# Patient Record
Sex: Male | Born: 1975
Health system: Southern US, Community
[De-identification: ages and names within clinical notes are randomized; demographics above are authoritative.]

## PROBLEM LIST (undated history)

## (undated) DIAGNOSIS — F1410 Cocaine abuse, uncomplicated: Secondary | ICD-10-CM

## (undated) DIAGNOSIS — I10 Essential (primary) hypertension: Secondary | ICD-10-CM

## (undated) DIAGNOSIS — T50901A Poisoning by unspecified drugs, medicaments and biological substances, accidental (unintentional), initial encounter: Secondary | ICD-10-CM

## (undated) DIAGNOSIS — F1110 Opioid abuse, uncomplicated: Secondary | ICD-10-CM

## (undated) DIAGNOSIS — E119 Type 2 diabetes mellitus without complications: Secondary | ICD-10-CM

## (undated) DIAGNOSIS — W3400XA Accidental discharge from unspecified firearms or gun, initial encounter: Secondary | ICD-10-CM

## (undated) HISTORY — PX: OTHER SURGICAL HISTORY: SHX169

## (undated) HISTORY — PX: ACHILLES TENDON SURGERY: SHX542

---

## 2016-06-25 ENCOUNTER — Emergency Department (HOSPITAL_COMMUNITY)
Admission: EM | Admit: 2016-06-25 | Discharge: 2016-06-25 | Disposition: A | Discharge status: Home / Self Care | Payer: Self-pay | Attending: Emergency Medicine | Admitting: Emergency Medicine

## 2016-06-25 ENCOUNTER — Encounter (HOSPITAL_COMMUNITY): Payer: Self-pay | Admitting: Emergency Medicine

## 2016-06-25 DIAGNOSIS — R197 Diarrhea, unspecified: Secondary | ICD-10-CM | POA: Insufficient documentation

## 2016-06-25 DIAGNOSIS — E119 Type 2 diabetes mellitus without complications: Secondary | ICD-10-CM | POA: Insufficient documentation

## 2016-06-25 DIAGNOSIS — F17200 Nicotine dependence, unspecified, uncomplicated: Secondary | ICD-10-CM | POA: Insufficient documentation

## 2016-06-25 DIAGNOSIS — I10 Essential (primary) hypertension: Secondary | ICD-10-CM | POA: Insufficient documentation

## 2016-06-25 DIAGNOSIS — R109 Unspecified abdominal pain: Secondary | ICD-10-CM

## 2016-06-25 DIAGNOSIS — R112 Nausea with vomiting, unspecified: Secondary | ICD-10-CM | POA: Insufficient documentation

## 2016-06-25 DIAGNOSIS — R1110 Vomiting, unspecified: Secondary | ICD-10-CM

## 2016-06-25 HISTORY — DX: Type 2 diabetes mellitus without complications: E11.9

## 2016-06-25 HISTORY — DX: Accidental discharge from unspecified firearms or gun, initial encounter: W34.00XA

## 2016-06-25 LAB — COMPREHENSIVE METABOLIC PANEL
ALBUMIN: 4.9 g/dL (ref 3.5–5.0)
ALT: 20 U/L (ref 17–63)
ANION GAP: 7 (ref 5–15)
AST: 20 U/L (ref 15–41)
Alkaline Phosphatase: 52 U/L (ref 38–126)
BILIRUBIN TOTAL: 0.5 mg/dL (ref 0.3–1.2)
BUN: 13 mg/dL (ref 6–20)
CHLORIDE: 105 mmol/L (ref 101–111)
CO2: 25 mmol/L (ref 22–32)
Calcium: 9.7 mg/dL (ref 8.9–10.3)
Creatinine, Ser: 1.13 mg/dL (ref 0.61–1.24)
GFR calc Af Amer: >60 mL/min (ref >60)
GLUCOSE: 185 mg/dL — AB (ref 65–99)
POTASSIUM: 3.4 mmol/L — AB (ref 3.5–5.1)
Sodium: 137 mmol/L (ref 135–145)
TOTAL PROTEIN: 7.9 g/dL (ref 6.5–8.1)

## 2016-06-25 LAB — URINALYSIS, ROUTINE W REFLEX MICROSCOPIC
Bilirubin Urine: NEGATIVE
GLUCOSE, UA: NEGATIVE mg/dL
Hgb urine dipstick: NEGATIVE
Ketones, ur: NEGATIVE mg/dL
LEUKOCYTES UA: NEGATIVE
NITRITE: NEGATIVE
PH: 7 (ref 5.0–8.0)
Protein, ur: NEGATIVE mg/dL
SPECIFIC GRAVITY, URINE: 1.018 (ref 1.005–1.030)

## 2016-06-25 LAB — CBC
HEMATOCRIT: 39.7 % (ref 39.0–52.0)
HEMOGLOBIN: 13.6 g/dL (ref 13.0–17.0)
MCH: 27.8 pg (ref 26.0–34.0)
MCHC: 34.3 g/dL (ref 30.0–36.0)
MCV: 81 fL (ref 78.0–100.0)
Platelets: 151 10*3/uL (ref 150–400)
RBC: 4.9 MIL/uL (ref 4.22–5.81)
RDW: 11.9 % (ref 11.5–15.5)
WBC: 10.5 10*3/uL (ref 4.0–10.5)

## 2016-06-25 LAB — LIPASE, BLOOD: LIPASE: 30 U/L (ref 11–51)

## 2016-06-25 LAB — CBG MONITORING, ED: Glucose-Capillary: 186 mg/dL — ABNORMAL HIGH (ref 65–99)

## 2016-06-25 MED ORDER — ONDANSETRON HCL 4 MG/2ML IJ SOLN
4.0000 mg | Freq: Once | INTRAMUSCULAR | Status: AC
  Administered 2016-06-25: 4 mg via INTRAVENOUS
  Filled 2016-06-25: qty 2

## 2016-06-25 MED ORDER — HALOPERIDOL LACTATE 5 MG/ML IJ SOLN
2.0000 mg | Freq: Once | INTRAMUSCULAR | Status: AC
  Administered 2016-06-25: 2 mg via INTRAVENOUS
  Filled 2016-06-25: qty 1

## 2016-06-25 MED ORDER — ONDANSETRON 4 MG PO TBDP: 4.0000 mg | ORAL_TABLET | Freq: Three times a day (TID) | ORAL | 0 refills | Status: DC | PRN

## 2016-06-25 MED ORDER — SODIUM CHLORIDE 0.9 % IV BOLUS (SEPSIS)
1000.0000 mL | Freq: Once | INTRAVENOUS | Status: AC
  Administered 2016-06-25: 1000 mL via INTRAVENOUS
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
## 2016-06-25 NOTE — ED Notes (Signed)
Patient states he ate fried chicken and instant mashed potato's for dinner last night. States he went to bed at midnight and awoke with nausea and vomiting at 0200 and 2 loose stools.  Patient complaining of diffuse intermittent pain with tenderness on palpation in all 4 quads. No vomiting at this time.

## 2016-06-25 NOTE — ED Notes (Signed)
Pt given ginger ale and graham crackers to see how he tolerates it.

## 2016-06-25 NOTE — ED Notes (Signed)
Pt ambulatory to restroom

## 2016-06-25 NOTE — ED Triage Notes (Signed)
Pt is c/o severe abd pain that started one to two hours ago  Pt is having nausea, vomiting, and diarrhea  Actively vomiting in triage

## 2017-06-17 ENCOUNTER — Encounter (HOSPITAL_COMMUNITY): Payer: Self-pay | Admitting: Emergency Medicine

## 2017-06-17 ENCOUNTER — Emergency Department (HOSPITAL_COMMUNITY)
Admission: EM | Admit: 2017-06-17 | Discharge: 2017-06-17 | Disposition: A | Discharge status: Home / Self Care | Payer: Self-pay | Attending: Emergency Medicine | Admitting: Emergency Medicine

## 2017-06-17 DIAGNOSIS — Z9114 Patient's other noncompliance with medication regimen: Secondary | ICD-10-CM | POA: Insufficient documentation

## 2017-06-17 DIAGNOSIS — I10 Essential (primary) hypertension: Secondary | ICD-10-CM | POA: Insufficient documentation

## 2017-06-17 DIAGNOSIS — M79672 Pain in left foot: Secondary | ICD-10-CM | POA: Insufficient documentation

## 2017-06-17 DIAGNOSIS — F17210 Nicotine dependence, cigarettes, uncomplicated: Secondary | ICD-10-CM | POA: Insufficient documentation

## 2017-06-17 DIAGNOSIS — E119 Type 2 diabetes mellitus without complications: Secondary | ICD-10-CM | POA: Insufficient documentation

## 2017-06-17 HISTORY — DX: Essential (primary) hypertension: I10

## 2017-06-17 LAB — BASIC METABOLIC PANEL
ANION GAP: 8 (ref 5–15)
BUN: 8 mg/dL (ref 6–20)
CHLORIDE: 105 mmol/L (ref 101–111)
CO2: 27 mmol/L (ref 22–32)
Calcium: 9.4 mg/dL (ref 8.9–10.3)
Creatinine, Ser: 1.06 mg/dL (ref 0.61–1.24)
GFR calc Af Amer: >60 mL/min (ref >60)
GFR calc non Af Amer: >60 mL/min (ref >60)
GLUCOSE: 172 mg/dL — AB (ref 65–99)
POTASSIUM: 3.9 mmol/L (ref 3.5–5.1)
Sodium: 140 mmol/L (ref 135–145)

## 2017-06-17 LAB — CBC
HEMATOCRIT: 39.2 % (ref 39.0–52.0)
HEMOGLOBIN: 13.2 g/dL (ref 13.0–17.0)
MCH: 27.7 pg (ref 26.0–34.0)
MCHC: 33.7 g/dL (ref 30.0–36.0)
MCV: 82.4 fL (ref 78.0–100.0)
Platelets: 181 10*3/uL (ref 150–400)
RBC: 4.76 MIL/uL (ref 4.22–5.81)
RDW: 12 % (ref 11.5–15.5)
WBC: 6.7 10*3/uL (ref 4.0–10.5)

## 2017-06-17 LAB — CBG MONITORING, ED: Glucose-Capillary: 169 mg/dL — ABNORMAL HIGH (ref 65–99)

## 2017-06-17 MED ORDER — LISINOPRIL 10 MG PO TABS: 10.0000 mg | ORAL_TABLET | Freq: Every day | ORAL | 2 refills | Status: DC

## 2017-06-17 MED ORDER — METFORMIN HCL 500 MG PO TABS: 500.0000 mg | ORAL_TABLET | Freq: Two times a day (BID) | ORAL | 2 refills | Status: DC

## 2017-06-17 NOTE — ED Triage Notes (Signed)
Patient here from home with complaints of hypertension. States that he has been out of medication for a year. Hx of diabetes. Out of metformin. Bilateral foot pain and swelling.
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
## 2017-06-28 ENCOUNTER — Emergency Department (HOSPITAL_COMMUNITY)
Admission: EM | Admit: 2017-06-28 | Discharge: 2017-06-29 | Disposition: A | Discharge status: Home / Self Care | Payer: Self-pay | Attending: Emergency Medicine | Admitting: Emergency Medicine

## 2017-06-28 ENCOUNTER — Emergency Department (HOSPITAL_COMMUNITY): Payer: Self-pay

## 2017-06-28 ENCOUNTER — Encounter (HOSPITAL_COMMUNITY): Payer: Self-pay

## 2017-06-28 DIAGNOSIS — J0110 Acute frontal sinusitis, unspecified: Secondary | ICD-10-CM | POA: Insufficient documentation

## 2017-06-28 DIAGNOSIS — I10 Essential (primary) hypertension: Secondary | ICD-10-CM | POA: Insufficient documentation

## 2017-06-28 DIAGNOSIS — E119 Type 2 diabetes mellitus without complications: Secondary | ICD-10-CM | POA: Insufficient documentation

## 2017-06-28 DIAGNOSIS — F17200 Nicotine dependence, unspecified, uncomplicated: Secondary | ICD-10-CM | POA: Insufficient documentation

## 2017-06-28 DIAGNOSIS — R062 Wheezing: Secondary | ICD-10-CM | POA: Insufficient documentation

## 2017-06-28 LAB — CBG MONITORING, ED: GLUCOSE-CAPILLARY: 181 mg/dL — AB (ref 65–99)

## 2017-06-28 MED ORDER — FLUTICASONE PROPIONATE 50 MCG/ACT NA SUSP: 1.0000 | Freq: Every day | NASAL | 0 refills | Status: DC

## 2017-06-28 MED ORDER — DOXYCYCLINE HYCLATE 100 MG PO CAPS: 100.0000 mg | ORAL_CAPSULE | Freq: Two times a day (BID) | ORAL | 0 refills | Status: AC

## 2017-06-28 MED ORDER — ALBUTEROL SULFATE (2.5 MG/3ML) 0.083% IN NEBU
2.5000 mg | INHALATION_SOLUTION | Freq: Once | RESPIRATORY_TRACT | Status: AC
Start: 2017-06-28 — End: 2017-06-28
  Administered 2017-06-28: 2.5 mg via RESPIRATORY_TRACT
  Filled 2017-06-28: qty 3

## 2017-06-28 MED ORDER — DOXYCYCLINE HYCLATE 100 MG PO TABS
100.0000 mg | ORAL_TABLET | Freq: Once | ORAL | Status: AC
  Administered 2017-06-28: 100 mg via ORAL
  Filled 2017-06-28: qty 1
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
## 2017-06-28 NOTE — ED Notes (Signed)
Hope, NP would like patients level of care to be higher due to his complaints and medical history. After patient returned from radiology, patient was moved to room 2. Care transferred to [facility] [place], [place].

## 2017-06-28 NOTE — ED Notes (Addendum)
Pt c/o congestion, productive cough with yellowish/greenish sputum, 9/10 headache that started Friday.

## 2017-06-28 NOTE — ED Triage Notes (Signed)
Pt c/o headache and cough/ congestion, and chills since Friday. He endorses green phlegm. Pt denies fevers, neck pain, or N/V. A&Ox4. States that his sugars have been WDL.

## 2017-06-29 ENCOUNTER — Telehealth: Payer: Self-pay | Admitting: Emergency Medicine

## 2017-06-29 MED ORDER — ACETAMINOPHEN 325 MG PO TABS
650.0000 mg | ORAL_TABLET | Freq: Once | ORAL | Status: AC
  Administered 2017-06-29: 650 mg via ORAL
  Filled 2017-06-29: qty 2

## 2017-06-29 MED FILL — DOXYCYCLINE 100 MG TABLET: 100 | 7 days supply | Qty: 14 | Fill #0

## 2017-06-29 NOTE — Telephone Encounter (Signed)
Pt called in stating he was told to go to [facility] for his two prescriptions from his ED visit last night.  Pt states they are too expensive.  Noted pt has a follow up appointment scheduled for 8/30.  Advised pt to go to the [facility] today to get his prescriptions.  Pt acknowledged understanding.  No further CM needs noted at this time.
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
## 2017-07-01 ENCOUNTER — Ambulatory Visit: Payer: Self-pay | Attending: Internal Medicine | Admitting: Physician Assistant

## 2017-07-01 VITALS — BP 138/88 | HR 75 | Temp 98.1°F | Resp 18 | Ht 66.0 in | Wt 196.0 lb

## 2017-07-01 DIAGNOSIS — M79672 Pain in left foot: Secondary | ICD-10-CM | POA: Insufficient documentation

## 2017-07-01 DIAGNOSIS — Z7984 Long term (current) use of oral hypoglycemic drugs: Secondary | ICD-10-CM | POA: Insufficient documentation

## 2017-07-01 DIAGNOSIS — R3911 Hesitancy of micturition: Secondary | ICD-10-CM

## 2017-07-01 DIAGNOSIS — J329 Chronic sinusitis, unspecified: Secondary | ICD-10-CM | POA: Insufficient documentation

## 2017-07-01 DIAGNOSIS — G629 Polyneuropathy, unspecified: Secondary | ICD-10-CM

## 2017-07-01 DIAGNOSIS — E1140 Type 2 diabetes mellitus with diabetic neuropathy, unspecified: Secondary | ICD-10-CM | POA: Insufficient documentation

## 2017-07-01 DIAGNOSIS — E1165 Type 2 diabetes mellitus with hyperglycemia: Secondary | ICD-10-CM | POA: Insufficient documentation

## 2017-07-01 DIAGNOSIS — M79671 Pain in right foot: Secondary | ICD-10-CM | POA: Insufficient documentation

## 2017-07-01 DIAGNOSIS — Z79899 Other long term (current) drug therapy: Secondary | ICD-10-CM | POA: Insufficient documentation

## 2017-07-01 DIAGNOSIS — E118 Type 2 diabetes mellitus with unspecified complications: Secondary | ICD-10-CM

## 2017-07-01 DIAGNOSIS — I10 Essential (primary) hypertension: Secondary | ICD-10-CM

## 2017-07-01 LAB — POCT URINALYSIS DIPSTICK
Bilirubin, UA: NEGATIVE
Glucose, UA: NEGATIVE
Ketones, UA: NEGATIVE
Leukocytes, UA: NEGATIVE
Nitrite, UA: NEGATIVE
PH UA: 5.5 (ref 5.0–8.0)
PROTEIN UA: NEGATIVE
RBC UA: NEGATIVE
SPEC GRAV UA: 1.02 (ref 1.010–1.025)
UROBILINOGEN UA: 0.2 U/dL

## 2017-07-01 LAB — POCT GLYCOSYLATED HEMOGLOBIN (HGB A1C): HEMOGLOBIN A1C: 6.4

## 2017-07-01 LAB — GLUCOSE, POCT (MANUAL RESULT ENTRY): POC GLUCOSE: 170 mg/dL — AB (ref 70–99)

## 2017-07-01 MED ORDER — LISINOPRIL 20 MG PO TABS: 20.0000 mg | ORAL_TABLET | Freq: Every day | ORAL | 3 refills | Status: DC

## 2017-07-01 MED ORDER — METFORMIN HCL 500 MG PO TABS: 500.0000 mg | ORAL_TABLET | Freq: Two times a day (BID) | ORAL | 2 refills | Status: DC

## 2017-07-01 MED ORDER — TRUE METRIX METER W/DEVICE KIT: 1.0000 | PACK | Freq: Three times a day (TID) | 0 refills | Status: AC

## 2017-07-01 MED ORDER — BD ULTRA-FINE LANCETS MISC: 12 refills | Status: AC

## 2017-07-01 MED ORDER — GABAPENTIN 300 MG PO CAPS: 300.0000 mg | ORAL_CAPSULE | Freq: Every day | ORAL | 3 refills | Status: DC

## 2017-07-01 MED ORDER — GLUCOSE BLOOD VI STRP: ORAL_STRIP | 12 refills | Status: AC

## 2017-07-01 MED FILL — ?METFORMIN HCL 500MG TABLET: 500 | 30 days supply | Qty: 60 | Fill #0

## 2017-07-01 MED FILL — !TRUE METRIX BLOOD GLUCOSE: 1 days supply | Qty: 1 | Fill #0

## 2017-07-01 MED FILL — LISINOPRIL 20 MG TAB: 20 | 30 days supply | Qty: 30 | Fill #0

## 2017-07-01 MED FILL — GABAPENTIN 300 MG CAPSULE: 300 | 30 days supply | Qty: 30 | Fill #0

## 2017-07-01 MED FILL — TRUEplus LANCETS 28G MISC: 30 days supply | Qty: 100 | Fill #0

## 2017-07-01 MED FILL — TRUE METRIX TEST STRIP: 30 days supply | Qty: 100 | Fill #0

## 2017-07-01 NOTE — Patient Instructions (Signed)
Check blood sugars 3-5 times per week fasting and record and bring to next visit. Diabetes Mellitus and Food It is important for you to manage your blood sugar (glucose) level. Your blood glucose level can be greatly affected by what you eat. Eating healthier foods in the appropriate amounts throughout the day at about the same time each day will help you control your blood glucose level. It can also help slow or prevent worsening of your diabetes mellitus. Healthy eating may even help you improve the level of your blood pressure and reach or maintain a healthy weight. General recommendations for healthful eating and cooking habits include:  Eating meals and snacks regularly. Avoid going long periods of time without eating to lose weight.  Eating a diet that consists mainly of plant-based foods, such as fruits, vegetables, nuts, legumes, and whole grains.  Using low-heat cooking methods, such as baking, instead of high-heat cooking methods, such as deep frying.  Work with your dietitian to make sure you understand how to use the Nutrition Facts information on food labels. How can food affect me? Carbohydrates Carbohydrates affect your blood glucose level more than any other type of food. Your dietitian will help you determine how many carbohydrates to eat at each meal and teach you how to count carbohydrates. Counting carbohydrates is important to keep your blood glucose at a healthy level, especially if you are using insulin or taking certain medicines for diabetes mellitus. Alcohol Alcohol can cause sudden decreases in blood glucose (hypoglycemia), especially if you use insulin or take certain medicines for diabetes mellitus. Hypoglycemia can be a life-threatening condition. Symptoms of hypoglycemia (sleepiness, dizziness, and disorientation) are similar to symptoms of having too much alcohol. If your health care provider has given you approval to drink alcohol, do so in moderation and use the  following guidelines:  Women should not have more than one drink per day, and men should not have more than two drinks per day. One drink is equal to: ? 12 oz of beer. ? 5 oz of wine. ? 1 oz of hard liquor.  Do not drink on an empty stomach.  Keep yourself hydrated. Have water, diet soda, or unsweetened iced tea.  Regular soda, juice, and other mixers might contain a lot of carbohydrates and should be counted.  What foods are not recommended? As you make food choices, it is important to remember that all foods are not the same. Some foods have fewer nutrients per serving than other foods, even though they might have the same number of calories or carbohydrates. It is difficult to get your body what it needs when you eat foods with fewer nutrients. Examples of foods that you should avoid that are high in calories and carbohydrates but low in nutrients include:  Trans fats (most processed foods list trans fats on the Nutrition Facts label).  Regular soda.  Juice.  Candy.  Sweets, such as cake, pie, doughnuts, and cookies.  Fried foods.  What foods can I eat? Eat nutrient-rich foods, which will nourish your body and keep you healthy. The food you should eat also will depend on several factors, including:  The calories you need.  The medicines you take.  Your weight.  Your blood glucose level.  Your blood pressure level.  Your cholesterol level.  You should eat a variety of foods, including:  Protein. ? Lean cuts of meat. ? Proteins low in saturated fats, such as fish, egg whites, and beans. Avoid processed meats.  Fruits and  vegetables. ? Fruits and vegetables that may help control blood glucose levels, such as apples, mangoes, and yams.  Dairy products. ? Choose fat-free or low-fat dairy products, such as milk, yogurt, and cheese.  Grains, bread, pasta, and rice. ? Choose whole grain products, such as multigrain bread, whole oats, and brown rice. These foods may  help control blood pressure.  Fats. ? Foods containing healthful fats, such as nuts, avocado, olive oil, canola oil, and fish.  Does everyone with diabetes mellitus have the same meal plan? Because every person with diabetes mellitus is different, there is not one meal plan that works for everyone. It is very important that you meet with a dietitian who will help you create a meal plan that is just right for you. This information is not intended to replace advice given to you by your health care provider. Make sure you discuss any questions you have with your health care provider. Document Released: 07/16/2005 Document Revised: 03/26/2016 Document Reviewed: 09/15/2013 Elsevier Interactive Patient Education  2017 [employer].

## 2017-07-29 ENCOUNTER — Encounter: Payer: Self-pay | Admitting: Family Medicine

## 2017-07-29 ENCOUNTER — Ambulatory Visit: Payer: Self-pay | Attending: Family Medicine | Admitting: Family Medicine

## 2017-07-29 VITALS — BP 137/90 | HR 70 | Temp 97.9°F | Resp 18 | Ht 66.0 in | Wt 200.8 lb

## 2017-07-29 DIAGNOSIS — Z79899 Other long term (current) drug therapy: Secondary | ICD-10-CM | POA: Insufficient documentation

## 2017-07-29 DIAGNOSIS — I10 Essential (primary) hypertension: Secondary | ICD-10-CM | POA: Insufficient documentation

## 2017-07-29 DIAGNOSIS — E119 Type 2 diabetes mellitus without complications: Secondary | ICD-10-CM | POA: Insufficient documentation

## 2017-07-29 DIAGNOSIS — E1140 Type 2 diabetes mellitus with diabetic neuropathy, unspecified: Secondary | ICD-10-CM | POA: Insufficient documentation

## 2017-07-29 DIAGNOSIS — Z7984 Long term (current) use of oral hypoglycemic drugs: Secondary | ICD-10-CM | POA: Insufficient documentation

## 2017-07-29 LAB — GLUCOSE, POCT (MANUAL RESULT ENTRY): POC GLUCOSE: 99 mg/dL (ref 70–99)

## 2017-07-29 MED ORDER — PREGABALIN 50 MG PO CAPS: 50.0000 mg | ORAL_CAPSULE | Freq: Every day | ORAL | 0 refills | Status: DC

## 2017-07-29 MED ORDER — METFORMIN HCL 500 MG PO TABS: 500.0000 mg | ORAL_TABLET | Freq: Two times a day (BID) | ORAL | 2 refills | Status: DC

## 2017-07-29 MED ORDER — LISINOPRIL 40 MG PO TABS: 40.0000 mg | ORAL_TABLET | Freq: Every day | ORAL | 2 refills | Status: DC

## 2017-07-29 MED FILL — ?METFORMIN HCL 500MG TABLET: 500 | 30 days supply | Qty: 60 | Fill #0

## 2017-07-29 MED FILL — LISINOPRIL 40 MG TABLET: 40 | 30 days supply | Qty: 30 | Fill #0

## 2017-07-29 NOTE — Progress Notes (Signed)
Patient is here for f/up   Patient complains both feet pain takes gabapentin but not works

## 2017-07-29 NOTE — Patient Instructions (Signed)
Type 2 Diabetes Mellitus, Self Care, Adult When you have type 2 diabetes (type 2 diabetes mellitus), you must keep your blood sugar (glucose) under control. You can do this with:  Nutrition.  Exercise.  Lifestyle changes.  Medicines or insulin, if needed.  Support from your doctors and others.  How do I manage my blood sugar?  Check your blood sugar level every day, as often as told.  Call your doctor if your blood sugar is above your goal numbers for 2 tests in a row.  Have your A1c (hemoglobin A1c) level checked at least two times a year. Have it checked more often if your doctor tells you to. Your doctor will set treatment goals for you. Generally, you should have these blood sugar levels:  Before meals (preprandial): 80-130 mg/dL (4.4-7.2 mmol/L).  After meals (postprandial): lower than 180 mg/dL (10 mmol/L).  A1c level: less than 7%.  What do I need to know about high blood sugar? High blood sugar is called hyperglycemia. Know the signs of high blood sugar. Signs may include:  Feeling: ? Thirsty. ? Hungry. ? Very tired.  Needing to pee (urinate) more than usual.  Blurry vision.  What do I need to know about low blood sugar? Low blood sugar is called hypoglycemia. This is when blood sugar is at or below 70 mg/dL (3.9 mmol/L). Symptoms may include:  Feeling: ? Hungry. ? Worried or nervous (anxious). ? Sweaty and clammy. ? Confused. ? Dizzy. ? Sleepy. ? Sick to your stomach (nauseous).  Having: ? A fast heartbeat (palpitations). ? A headache. ? A change in your vision. ? Jerky movements that you cannot control (seizure). ? Nightmares. ? Tingling or no feeling (numbness) around the mouth, lips, or tongue.  Having trouble with: ? Talking. ? Paying attention (concentrating). ? Moving (coordination). ? Sleeping.  Shaking.  Passing out (fainting).  Getting upset easily (irritability).  Treating low blood sugar  To treat low blood sugar, eat or  drink something sugary right away. If you can think clearly and swallow safely, follow the 15:15 rule:  Take 15 grams of a fast-acting carb (carbohydrate). Some fast-acting carbs are: ? 1 tube of glucose gel. ? 3 sugar tablets (glucose pills). ? 6-8 pieces of hard candy. ? 4 oz (120 mL) of fruit juice. ? 4 oz (120 mL) regular (not diet) soda.  Check your blood sugar 15 minutes after you take the carb.  If your blood sugar is still at or below 70 mg/dL (3.9 mmol/L), take 15 grams of a carb again.  If your blood sugar does not go above 70 mg/dL (3.9 mmol/L) after 3 tries, get help right away.  After your blood sugar goes back to normal, eat a meal or a snack within 1 hour.  Treating very low blood sugar If your blood sugar is at or below 54 mg/dL (3 mmol/L), you have very low blood sugar (severe hypoglycemia). This is an emergency. Do not wait to see if the symptoms will go away. Get medical help right away. Call your local emergency services (911 in the [place] Do not drive yourself to the hospital. If you have very low blood sugar and you cannot eat or drink, you may need a glucagon shot (injection). A family member or friend should learn how to check your blood sugar and how to give you a glucagon shot. Ask your doctor if you need to have a glucagon shot kit at home. What else is important to manage my diabetes? Medicine  Follow these instructions about insulin and diabetes medicines:  Take them as told by your doctor.  Adjust them as told by your doctor.  Do not run out of them.  Having diabetes can raise your risk for other long-term conditions. These include heart or kidney disease. Your doctor may prescribe medicines to help prevent problems from diabetes. Food   Make healthy food choices. These include: ? Chicken, fish, egg whites, and beans. ? Oats, whole wheat, bulgur, brown rice, quinoa, and millet. ? Fresh fruits and vegetables. ? Low-fat dairy products. ? Nuts,  avocado, olive oil, and canola oil.  Make a food plan with a specialist (dietitian).  Follow instructions from your doctor about what you cannot eat or drink.  Drink enough fluid to keep your pee (urine) clear or pale yellow.  Eat healthy snacks between healthy meals.  Keep track of carbs that you eat. Read food labels. Learn food serving sizes.  Follow your sick day plan when you cannot eat or drink normally. Make this plan with your doctor so it is ready to use. Activity  Exercise at least 3 times a week.  Do not go more than 2 days without exercising.  Talk with your doctor before you start a new exercise. Your doctor may need to adjust your insulin, medicines, or food. Lifestyle   Do not use any tobacco products. These include cigarettes, chewing tobacco, and e-cigarettes.If you need help quitting, ask your doctor.  Ask your doctor how much alcohol is safe for you.  Learn to deal with stress. If you need help with this, ask your doctor. Body care  Stay up to date with your shots (immunizations).  Have your eyes and feet checked by a doctor as often as told.  Check your skin and feet every day. Check for cuts, bruises, redness, blisters, or sores.  Brush your teeth and gums two times a day.  Floss at least one time a day.  Go to the dentist least one time every 6 months.  Stay at a healthy weight. General instructions   Take over-the-counter and prescription medicines only as told by your doctor.  Share your diabetes care plan with: ? Your work or school. ? People you live with.  Check your pee (urine) for ketones: ? When you are sick. ? As told by your doctor.  Carry a card or wear jewelry that says that you have diabetes.  Ask your doctor: ? Do I need to meet with a diabetes educator? ? Where can I find a support group for people with diabetes?  Keep all follow-up visits as told by your doctor. This is important. Where to find more information: To  learn more about diabetes, visit:  American Diabetes Association: www.diabetes.org  American Association of Diabetes Educators: www.diabeteseducator.org/patient-resources  This information is not intended to replace advice given to you by your health care provider. Make sure you discuss any questions you have with your health care provider. Document Released: 02/10/2016 Document Revised: 03/26/2016 Document Reviewed: 11/22/2015 Elsevier Interactive Patient Education  [employer].

## 2017-07-29 NOTE — Progress Notes (Signed)
Subjective:  Patient ID: Craig Hebert, male    DOB: 23-Jun-1976  Age: 41 y.o. MRN: [id number]  CC: Diabetes and Hypertension   HPI Craig Hebert presents for DM and HTN. History of DM. Symptoms: paresthesia of the feet. Patient denies foot ulcerations, nausea, polydipsia, polyuria, visual disturbances and vomitting.  Reports taking gabapentin for symptoms but request another medication for symptoms.Evaluation to date has been included: fasting blood sugar, fasting lipid panel and hemoglobin A1C.  Home sugars: BGs range between 90 and 160. Treatment to date: metformin. History of HTN.  He is not exercising and is not adherent to low salt diet.  He does not check BP at home. Cardiac symptoms none. Patient denies chest pain, chest pressure/discomfort, claudication, dyspnea, lower extremity edema, near-syncope, palpitations and syncope.  Cardiovascular risk factors: dyslipidemia, hypertension, male gender and sedentary lifestyle. Use of agents associated with hypertension: none. History of target organ damage: none.    Outpatient Medications Prior to Visit  Medication Sig Dispense Refill  . BD ULTRA-FINE LANCETS lancets Use as instructed 100 each 12  . Blood Glucose Monitoring Suppl (TRUE METRIX METER) w/Device KIT 1 each by Does not apply route 3 (three) times daily. 1 kit 0  . fluticasone (FLONASE) 50 MCG/ACT nasal spray Place 1 spray into both nostrils daily. 16 g 0  . glucose blood (TRUE METRIX BLOOD GLUCOSE TEST) test strip Use as instructed 100 each 12  . gabapentin (NEURONTIN) 300 MG capsule Take 1 capsule (300 mg total) by mouth at bedtime. 30 capsule 3  . lisinopril (PRINIVIL,ZESTRIL) 20 MG tablet Take 1 tablet (20 mg total) by mouth daily. 90 tablet 3  . metFORMIN (GLUCOPHAGE) 500 MG tablet Take 1 tablet (500 mg total) by mouth 2 (two) times daily with a meal. 60 tablet 2   No facility-administered medications prior to visit.     ROS Review of Systems  Constitutional: Negative.     Eyes: Negative.   Respiratory: Negative.   Cardiovascular: Negative.   Gastrointestinal: Negative.   Endocrine: Negative.   Skin: Negative.   Neurological: Negative for numbness.    Objective:  BP 137/90   Pulse 70   Temp 97.9 F (36.6 C) (Oral)   Resp 18   Ht '5\' 6"'$  (1.676 m)   Wt 200 lb 12.8 oz (91.1 kg)   SpO2 98%   BMI 32.41 kg/m   BP/Weight 07/29/2017 07/01/2017 6/76/7209  Systolic BP 470 962 836  Diastolic BP 90 88 98  Wt. (Lbs) 200.8 196 -  BMI 32.41 31.64 -    Physical Exam  Constitutional: He appears well-developed and well-nourished.  Eyes: Pupils are equal, round, and reactive to light. Conjunctivae are normal.  Cardiovascular: Normal rate, regular rhythm, normal heart sounds and intact distal pulses.   Pulmonary/Chest: Effort normal and breath sounds normal.  Abdominal: Soft. Bowel sounds are normal.  Skin: Skin is warm and dry.  Nursing note and vitals reviewed.     Diabetic Foot Exam - Simple   Simple Foot Form Diabetic Foot exam was performed with the following findings:  Yes 07/29/2017  2:10 AM  Visual Inspection No deformities, no ulcerations, no other skin breakdown bilaterally:  Yes Sensation Testing Intact to touch and monofilament testing bilaterally:  Yes Pulse Check Posterior Tibialis and Dorsalis pulse intact bilaterally:  Yes Comments       Assessment & Plan:   1. Type 2 diabetes mellitus with diabetic neuropathy, without long-term current use of insulin (HCC)  - Glucose (CBG) - Ambulatory  referral to Ophthalmology - Lipid Panel - lisinopril (PRINIVIL,ZESTRIL) 40 MG tablet; Take 1 tablet (40 mg total) by mouth daily.  Dispense: 30 tablet; Refill: 2 - metFORMIN (GLUCOPHAGE) 500 MG tablet; Take 1 tablet (500 mg total) by mouth 2 (two) times daily with a meal.  Dispense: 60 tablet; Refill: 2 - pregabalin (LYRICA) 50 MG capsule; Take 1 capsule (50 mg total) by mouth daily.  Dispense: 30 capsule; Refill: 0 - gabapentin (NEURONTIN) 300  MG capsule; Take 1 capsule (300 mg total) by mouth 2 (two) times daily.  Dispense: 60 capsule; Refill: 0  2. Hypertension, unspecified type Schedule BP recheck in 2 weeks with nurse. -If BP is greater than 90/60 (MAP 65 or greater) but not less than 130/80 may add dose of amlodipine 2.5 mg QD and recheck in another 2 weeks with nurse. - lisinopril (PRINIVIL,ZESTRIL) 40 MG tablet; Take 1 tablet (40 mg total) by mouth daily.  Dispense: 30 tablet; Refill: 2   Meds ordered this encounter  Medications  . DISCONTD: pregabalin (LYRICA) 50 MG capsule    Sig: Take 1 capsule (50 mg total) by mouth daily.    Dispense:  30 capsule    Refill:  0    Order Specific Question:   Supervising Provider    Answer:   Tresa Garter [id number]  . lisinopril (PRINIVIL,ZESTRIL) 40 MG tablet    Sig: Take 1 tablet (40 mg total) by mouth daily.    Dispense:  30 tablet    Refill:  2    Order Specific Question:   Supervising Provider    Answer:   Tresa Garter [id number]  . metFORMIN (GLUCOPHAGE) 500 MG tablet    Sig: Take 1 tablet (500 mg total) by mouth 2 (two) times daily with a meal.    Dispense:  60 tablet    Refill:  2    Order Specific Question:   Supervising Provider    Answer:   Tresa Garter [id number]  . DISCONTD: gabapentin (NEURONTIN) 300 MG capsule    Sig: Take 1 capsule (300 mg total) by mouth 2 (two) times daily.    Dispense:  60 capsule    Refill:  0    Order Specific Question:   Supervising Provider    Answer:   Tresa Garter [id number]  . pregabalin (LYRICA) 50 MG capsule    Sig: Take 1 capsule (50 mg total) by mouth daily.    Dispense:  30 capsule    Refill:  0    May discontinue gabapentin once lyrica is available.    Order Specific Question:   Supervising Provider    Answer:   Tresa Garter [id number]  . gabapentin (NEURONTIN) 300 MG capsule    Sig: Take 1 capsule (300 mg total) by mouth 2 (two) times daily.    Dispense:  60 capsule    Refill:  0     Order Specific Question:   Supervising Provider    Answer:   Tresa Garter [[id number]]    Follow-up: Return in about 2 weeks (around 08/12/2017) for BP check with [facility].   Alfonse Spruce FNP

## 2017-07-30 LAB — LIPID PANEL
CHOL/HDL RATIO: 4.8 ratio (ref 0.0–5.0)
Cholesterol, Total: 195 mg/dL (ref 100–199)
HDL: 41 mg/dL (ref >39)
LDL Calculated: 134 mg/dL — ABNORMAL HIGH (ref 0–99)
Triglycerides: 98 mg/dL (ref 0–149)
VLDL Cholesterol Cal: 20 mg/dL (ref 5–40)

## 2017-07-30 MED ORDER — GABAPENTIN 300 MG PO CAPS: 300.0000 mg | ORAL_CAPSULE | Freq: Two times a day (BID) | ORAL | 0 refills | Status: DC

## 2017-07-30 MED ORDER — PREGABALIN 50 MG PO CAPS: 50.0000 mg | ORAL_CAPSULE | Freq: Every day | ORAL | 0 refills | Status: DC

## 2017-08-02 ENCOUNTER — Other Ambulatory Visit: Payer: Self-pay | Admitting: Family Medicine

## 2017-08-02 DIAGNOSIS — E7800 Pure hypercholesterolemia, unspecified: Secondary | ICD-10-CM

## 2017-08-02 MED ORDER — PRAVASTATIN SODIUM 20 MG PO TABS: 20.0000 mg | ORAL_TABLET | Freq: Every day | ORAL | 2 refills | Status: DC

## 2017-08-03 ENCOUNTER — Telehealth: Payer: Self-pay

## 2017-08-03 MED FILL — PRAVASTATIN NA 20 MG TAB: 20 | 30 days supply | Qty: 30 | Fill #0

## 2017-08-03 NOTE — Telephone Encounter (Signed)
-----   Message from Lizbeth Bark, FNP sent at 08/02/2017  5:48 PM EDT ----- LDL cholesterol level is elevated.  This can increase your risk of heart disease overtime. Start eating a diet low in saturated fat. Limit your intake of fried foods, red meats, and whole milk. Increase activity. Recommend follow up in 3 months.

## 2017-08-03 NOTE — Telephone Encounter (Signed)
CMA call regarding lab results   Patient verify DOB  Patient was aware and understood  

## 2017-08-06 ENCOUNTER — Other Ambulatory Visit: Payer: Self-pay | Admitting: *Deleted

## 2017-08-06 DIAGNOSIS — E1140 Type 2 diabetes mellitus with diabetic neuropathy, unspecified: Secondary | ICD-10-CM

## 2017-08-06 MED ORDER — GABAPENTIN 300 MG PO CAPS: 300.0000 mg | ORAL_CAPSULE | Freq: Two times a day (BID) | ORAL | 0 refills | Status: DC

## 2017-08-06 MED FILL — GABAPENTIN 300 MG CAPSULE: 300 | 30 days supply | Qty: 60 | Fill #0

## 2017-08-06 NOTE — Telephone Encounter (Signed)
GABAPENTIN REFILLED TO [facility].

## 2017-08-12 ENCOUNTER — Ambulatory Visit: Payer: Self-pay | Attending: Family Medicine | Admitting: *Deleted

## 2017-08-12 VITALS — BP 157/80 | HR 80

## 2017-08-12 DIAGNOSIS — I10 Essential (primary) hypertension: Secondary | ICD-10-CM

## 2017-08-12 NOTE — Progress Notes (Signed)
Pt arrived to [facility], alert and oriented. He arrives in good spirits.  Last OV 07/29/2017  with PCP, patient BP was 137/90. Pt denies chest pain, SOB or dizziness. He states that there are certain times his vision is blurred in right eye. He has had concern prior to taking BP medication. Pt has referral in place to see Opthalmologist.    Reviewed medication with patiet. Pt states medication was not taken today.   Manual blood pressure reading: 150/70. He states he has not taken BP medication this morning. Advised to return  for BP check .

## 2017-09-06 MED FILL — GABAPENTIN 300 MG CAPSULE: 300 | 30 days supply | Qty: 60 | Fill #0

## 2017-09-06 MED FILL — PRAVASTATIN NA 20 MG TAB: 20 | 30 days supply | Qty: 30 | Fill #1

## 2017-09-06 MED FILL — LISINOPRIL 40 MG TABLET: 40 | 30 days supply | Qty: 30 | Fill #1

## 2017-10-05 ENCOUNTER — Other Ambulatory Visit: Payer: Self-pay | Admitting: Family Medicine

## 2017-10-05 DIAGNOSIS — E1140 Type 2 diabetes mellitus with diabetic neuropathy, unspecified: Secondary | ICD-10-CM

## 2017-10-05 MED FILL — PRAVASTATIN NA 20 MG TAB: 20 | 30 days supply | Qty: 30 | Fill #2

## 2017-10-05 MED FILL — metFORMIN HCL 500 MG TABS: 500 | 30 days supply | Qty: 60 | Fill #1

## 2017-10-05 MED FILL — LISINOPRIL 40 MG TAB: 40 | 30 days supply | Qty: 30 | Fill #2

## 2017-10-06 MED FILL — GABAPENTIN 300 MG CAPSULE: 300 | 30 days supply | Qty: 60 | Fill #0

## 2017-10-22 ENCOUNTER — Other Ambulatory Visit: Payer: Self-pay | Admitting: *Deleted

## 2017-10-22 DIAGNOSIS — E1140 Type 2 diabetes mellitus with diabetic neuropathy, unspecified: Secondary | ICD-10-CM

## 2017-10-22 MED ORDER — PREGABALIN 50 MG PO CAPS: 50.0000 mg | ORAL_CAPSULE | Freq: Every day | ORAL | 1 refills | Status: DC

## 2017-10-22 NOTE — Telephone Encounter (Signed)
PRINTED FOR PASS PROGRAM 

## 2017-11-16 ENCOUNTER — Other Ambulatory Visit: Payer: Self-pay | Admitting: Family Medicine

## 2017-11-16 DIAGNOSIS — E7800 Pure hypercholesterolemia, unspecified: Secondary | ICD-10-CM

## 2017-11-16 DIAGNOSIS — E1140 Type 2 diabetes mellitus with diabetic neuropathy, unspecified: Secondary | ICD-10-CM

## 2017-11-16 DIAGNOSIS — I10 Essential (primary) hypertension: Secondary | ICD-10-CM

## 2017-11-16 MED ORDER — PRAVASTATIN SODIUM 20 MG PO TABS: 20.0000 mg | ORAL_TABLET | Freq: Every day | ORAL | 2 refills | Status: DC

## 2017-11-16 MED FILL — LISINOPRIL 40 MG TAB: 40 | 30 days supply | Qty: 30 | Fill #0

## 2017-11-17 MED FILL — PRAVASTATIN NA 20 MG TAB: 20 | 30 days supply | Qty: 30 | Fill #0

## 2017-11-18 ENCOUNTER — Encounter: Payer: Self-pay | Admitting: Family Medicine

## 2017-11-18 ENCOUNTER — Ambulatory Visit: Payer: Self-pay | Attending: Family Medicine | Admitting: Family Medicine

## 2017-11-18 VITALS — BP 137/79 | HR 75 | Temp 97.8°F | Resp 16 | Ht 66.0 in | Wt 199.2 lb

## 2017-11-18 DIAGNOSIS — E1165 Type 2 diabetes mellitus with hyperglycemia: Secondary | ICD-10-CM | POA: Insufficient documentation

## 2017-11-18 DIAGNOSIS — E119 Type 2 diabetes mellitus without complications: Secondary | ICD-10-CM

## 2017-11-18 DIAGNOSIS — Z79899 Other long term (current) drug therapy: Secondary | ICD-10-CM | POA: Insufficient documentation

## 2017-11-18 DIAGNOSIS — E1140 Type 2 diabetes mellitus with diabetic neuropathy, unspecified: Secondary | ICD-10-CM | POA: Insufficient documentation

## 2017-11-18 DIAGNOSIS — B070 Plantar wart: Secondary | ICD-10-CM | POA: Insufficient documentation

## 2017-11-18 DIAGNOSIS — E7800 Pure hypercholesterolemia, unspecified: Secondary | ICD-10-CM | POA: Insufficient documentation

## 2017-11-18 DIAGNOSIS — Z23 Encounter for immunization: Secondary | ICD-10-CM | POA: Insufficient documentation

## 2017-11-18 DIAGNOSIS — Z7984 Long term (current) use of oral hypoglycemic drugs: Secondary | ICD-10-CM | POA: Insufficient documentation

## 2017-11-18 DIAGNOSIS — I10 Essential (primary) hypertension: Secondary | ICD-10-CM | POA: Insufficient documentation

## 2017-11-18 DIAGNOSIS — B353 Tinea pedis: Secondary | ICD-10-CM | POA: Insufficient documentation

## 2017-11-18 LAB — POCT GLYCOSYLATED HEMOGLOBIN (HGB A1C): Hemoglobin A1C: 10.5

## 2017-11-18 LAB — GLUCOSE, POCT (MANUAL RESULT ENTRY): POC Glucose: 478 mg/dl — AB (ref 70–99)

## 2017-11-18 MED ORDER — TERBINAFINE HCL 1 % EX CREA
1.0000 | TOPICAL_CREAM | Freq: Two times a day (BID) | CUTANEOUS | 0 refills | Status: DC
Start: 2017-11-18 — End: 2019-04-14

## 2017-11-18 MED ORDER — PREGABALIN 50 MG PO CAPS: 50.0000 mg | ORAL_CAPSULE | Freq: Every day | ORAL | 1 refills | Status: DC

## 2017-11-18 MED ORDER — GLIPIZIDE 5 MG PO TABS: 5.0000 mg | ORAL_TABLET | Freq: Every day | ORAL | 2 refills | Status: DC

## 2017-11-18 MED ORDER — METFORMIN HCL 1000 MG PO TABS: 1000.0000 mg | ORAL_TABLET | Freq: Two times a day (BID) | ORAL | 2 refills | Status: DC

## 2017-11-18 MED FILL — ?METFORMIN HCL 1,000 MG TAB: 1000 | 30 days supply | Qty: 60 | Fill #0

## 2017-11-18 MED FILL — ?GLIPIZIDE 5MG TABLET: 5 | 30 days supply | Qty: 30 | Fill #0

## 2017-11-18 NOTE — Progress Notes (Signed)
Subjective:  Patient ID: Craig Hebert, male    DOB: 1976/05/11  Age: 42 y.o. MRN: [id number]  CC: Foot Problem   HPI Craig Hebert presents for follow up for diabetes and hypertension. He also has foot complaint.   CHRONIC DIABETES  Disease Monitoring  Blood Sugar Ranges: he does not check CBG's daily at home.  Polyuria: yes   Visual problems: yes   Medication Compliance: yes  Medication Side Effects  Hypoglycemia: no   Preventitive Health Care  Eye Exam: He has not had diabetic eye exam w/in the last year. He reports blurred vision last 2 months.   Diet pattern: He is not eating a carb modified diet.  Exercise: no  CHRONIC HYPERTENSION  Disease Monitoring  Blood pressure range: He does not check BP at home.  Chest pain: no   Dyspnea: no   Claudication: no   Medication compliance: yes  Medication Side Effects  Lightheadedness: no   Edema: no    Preventitive Healthcare:  Exercise: no   Diet Pattern: regular  Salt Restriction: no  Foot complaint: Right plantar foot pain. Onset one week ago. He denies any history of injury or wounds. Associated symptoms include tenderness with standing. He denies taking anything for symptoms. History of diabetic feet neuropathy.     Outpatient Medications Prior to Visit  Medication Sig Dispense Refill  . BD ULTRA-FINE LANCETS lancets Use as instructed 100 each 12  . Blood Glucose Monitoring Suppl (TRUE METRIX METER) w/Device KIT 1 each by Does not apply route 3 (three) times daily. 1 kit 0  . fluticasone (FLONASE) 50 MCG/ACT nasal spray Place 1 spray into both nostrils daily. 16 g 0  . gabapentin (NEURONTIN) 300 MG capsule TAKE 1 CAPSULE BY MOUTH TWICE DAILY. 60 capsule 0  . glucose blood (TRUE METRIX BLOOD GLUCOSE TEST) test strip Use as instructed 100 each 12  . lisinopril (PRINIVIL,ZESTRIL) 40 MG tablet TAKE 1 TABLET BY MOUTH DAILY. 30 tablet 2  . pravastatin (PRAVACHOL) 20 MG tablet Take 1 tablet (20 mg total) by mouth  daily. 30 tablet 2  . metFORMIN (GLUCOPHAGE) 500 MG tablet Take 1 tablet (500 mg total) by mouth 2 (two) times daily with a meal. 60 tablet 2  . pregabalin (LYRICA) 50 MG capsule Take 1 capsule (50 mg total) by mouth daily. (Patient not taking: Reported on 11/18/2017) 90 capsule 1   No facility-administered medications prior to visit.     ROS Review of Systems  Constitutional: Negative.   Eyes: Positive for visual disturbance.  Respiratory: Negative.   Cardiovascular: Negative.   Endocrine: Positive for polydipsia.  Musculoskeletal:       Foot pain  Psychiatric/Behavioral: Negative.    Objective:  BP 137/79 (BP Location: Left Arm, Patient Position: Sitting, Cuff Size: Normal)   Pulse 75   Temp 97.8 F (36.6 C) (Oral)   Resp 16   Ht '5\' 6"'$  (1.676 m)   Wt 199 lb 3.2 oz (90.4 kg)   SpO2 94%   BMI 32.15 kg/m   BP/Weight 11/18/2017 08/12/2017 05/17/9677  Systolic BP 938 101 751  Diastolic BP 79 80 90  Wt. (Lbs) 199.2 - 200.8  BMI 32.15 - 32.41     Physical Exam  Constitutional: He appears well-developed and well-nourished.  Eyes: Conjunctivae are normal. Pupils are equal, round, and reactive to light.  Cardiovascular: Normal rate, regular rhythm, normal heart sounds and intact distal pulses.  Pulmonary/Chest: Effort normal and breath sounds normal.  Abdominal: Soft. Bowel sounds are  normal.  Musculoskeletal:       Feet:  Skin: Skin is warm and dry. Rash (bilateral  feet plantar surface ) noted.  Psychiatric: His mood appears anxious. He expresses no homicidal and no suicidal ideation. He expresses no suicidal plans and no homicidal plans. He is communicative. He is attentive.  Nursing note and vitals reviewed.    Assessment & Plan:   1. Uncontrolled type 2 diabetes mellitus with hyperglycemia (HCC) Glipizide added. Start checking CBG's . Bring glucometer or blood glucose log to next office visit. - POCT glycosylated hemoglobin (Hb A1C) - Glucose (CBG) - CMP and  Liver - metFORMIN (GLUCOPHAGE) 1000 MG tablet; Take 1 tablet (1,000 mg total) by mouth 2 (two) times daily with a meal.  Dispense: 60 tablet; Refill: 2 - glipiZIDE (GLUCOTROL) 5 MG tablet; Take 1 tablet (5 mg total) by mouth daily before breakfast.  Dispense: 30 tablet; Refill: 2 - Ambulatory referral to Ophthalmology  2. Type 2 diabetes mellitus with diabetic neuropathy, without long-term current use of insulin (HCC)  - pregabalin (LYRICA) 50 MG capsule; Take 1 capsule (50 mg total) by mouth daily.  Dispense: 90 capsule; Refill: 1  3. Plantar wart of right foot  - Ambulatory referral to Podiatry  4. Elevated low density lipoprotein (LDL) cholesterol level  - CMP and Liver  5. Tinea pedis of both feet  - terbinafine (LAMISIL AT) 1 % cream; Apply 1 application topically 2 (two) times daily. For 14 days.  Dispense: 30 g; Refill: 0  6. Needs flu shot  - Flu Vaccine QUAD 6+ mos PF IM (Fluarix Quad PF)     Follow-up: Return in about 2 weeks (around 12/02/2017) for DM with St[facility].   Alfonse Spruce FNP

## 2017-11-18 NOTE — Progress Notes (Signed)
Patient stated he have a knot on the bottom of his right foot. Patient stated it hurts when he walk and started a week ago.

## 2017-11-18 NOTE — Patient Instructions (Addendum)
Blood Glucose Monitoring, Adult Monitoring your blood sugar (glucose) helps you manage your diabetes. It also helps you and your health care provider determine how well your diabetes management plan is working. Blood glucose monitoring involves checking your blood glucose as often as directed, and keeping a record (log) of your results over time. Why should I monitor my blood glucose? Checking your blood glucose regularly can:  Help you understand how food, exercise, illnesses, and medicines affect your blood glucose.  Let you know what your blood glucose is at any time. You can quickly tell if you are having low blood glucose (hypoglycemia) or high blood glucose (hyperglycemia).  Help you and your health care provider adjust your medicines as needed.  When should I check my blood glucose? Follow instructions from your health care provider about how often to check your blood glucose. This may depend on:  The type of diabetes you have.  How well-controlled your diabetes is.  Medicines you are taking.  If you have type 1 diabetes:  Check your blood glucose at least 2 times a day.  Also check your blood glucose: ? Before every insulin injection. ? Before and after exercise. ? Between meals. ? 2 hours after a meal. ? Occasionally between 2:00 a.m. and 3:00 a.m., as directed. ? Before potentially dangerous tasks, like driving or using heavy machinery. ? At bedtime.  You may need to check your blood glucose more often, up to 6-10 times a day: ? If you use an insulin pump. ? If you need multiple daily injections (MDI). ? If your diabetes is not well-controlled. ? If you are ill. ? If you have a history of severe hypoglycemia. ? If you have a history of not knowing when your blood glucose is getting low (hypoglycemia unawareness). If you have type 2 diabetes:  If you take insulin or other diabetes medicines, check your blood glucose at least 2 times a day.  If you are on intensive  insulin therapy, check your blood glucose at least 4 times a day. Occasionally, you may also need to check between 2:00 a.m. and 3:00 a.m., as directed.  Also check your blood glucose: ? Before and after exercise. ? Before potentially dangerous tasks, like driving or using heavy machinery.  You may need to check your blood glucose more often if: ? Your medicine is being adjusted. ? Your diabetes is not well-controlled. ? You are ill. What is a blood glucose log?  A blood glucose log is a record of your blood glucose readings. It helps you and your health care provider: ? Look for patterns in your blood glucose over time. ? Adjust your diabetes management plan as needed.  Every time you check your blood glucose, write down your result and notes about things that may be affecting your blood glucose, such as your diet and exercise for the day.  Most glucose meters store a record of glucose readings in the meter. Some meters allow you to download your records to a computer. How do I check my blood glucose? Follow these steps to get accurate readings of your blood glucose: Supplies needed   Blood glucose meter.  Test strips for your meter. Each meter has its own strips. You must use the strips that come with your meter.  A needle to prick your finger (lancet). Do not use lancets more than once.  A device that holds the lancet (lancing device).  A journal or log book to write down your results. Procedure  Wash your hands with soap and water.  Prick the side of your finger (not the tip) with the lancet. Use a different finger each time.  Gently rub the finger until a small drop of blood appears.  Follow instructions that come with your meter for inserting the test strip, applying blood to the strip, and using your blood glucose meter.  Write down your result and any notes. Alternative testing sites  Some meters allow you to use areas of your body other than your finger  (alternative sites) to test your blood.  If you think you may have hypoglycemia, or if you have hypoglycemia unawareness, do not use alternative sites. Use your finger instead.  Alternative sites may not be as accurate as the fingers, because blood flow is slower in these areas. This means that the result you get may be delayed, and it may be different from the result that you would get from your finger.  The most common alternative sites are: ? Forearm. ? Thigh. ? Palm of the hand. Additional tips  Always keep your supplies with you.  If you have questions or need help, all blood glucose meters have a 24-hour "hotline" number that you can call. You may also contact your health care provider.  After you use a few boxes of test strips, adjust (calibrate) your blood glucose meter by following instructions that came with your meter. This information is not intended to replace advice given to you by your health care provider. Make sure you discuss any questions you have with your health care provider. Document Released: 10/22/2003 Document Revised: 05/08/2016 Document Reviewed: 03/30/2016 Elsevier Interactive Patient Education  2017 [employer] Inc[employer]  Athlete's Foot Athlete's foot (tinea pedis) is a fungal infection of the skin on the feet. It often occurs on the skin that is between or underneath the toes. It can also occur on the soles of the feet. The infection can spread from person to person (is contagious). Follow these instructions at home:  Apply or take over-the-counter and prescription medicines only as told by your doctor.  Keep all follow-up visits as told by your doctor. This is important.  Do not scratch your feet.  Keep your feet dry: ? Wear cotton or wool socks. Change your socks every day or if they become wet. ? Wear shoes that allow air to move around, such as sandals or canvas tennis shoes.  Wash and dry your feet: ? Every day or as told by your doctor. ? After  exercising. ? Including the area between your toes.  Wear sandals in wet areas, such as locker rooms and shared showers.  Do not share any of these items: ? Towels. ? Nail clippers. ? Other personal items that touch your feet.  If you have diabetes, keep your blood sugar under control. Contact a doctor if:  You have a fever.  You have swelling, soreness, warmth, or redness in your foot.  You are not getting better with treatment.  Your symptoms get worse.  You have new symptoms. This information is not intended to replace advice given to you by your health care provider. Make sure you discuss any questions you have with your health care provider. Document Released: 04/06/2008 Document Revised: 03/26/2016 Document Reviewed: 04/22/2015 Elsevier Interactive Patient Education  2018 [employer]

## 2017-11-19 LAB — CMP AND LIVER
ALBUMIN: 4.9 g/dL (ref 3.5–5.5)
ALT: 35 IU/L (ref 0–44)
AST: 19 IU/L (ref 0–40)
Alkaline Phosphatase: 71 IU/L (ref 39–117)
BUN: 8 mg/dL (ref 6–24)
Bilirubin Total: 0.3 mg/dL (ref 0.0–1.2)
Bilirubin, Direct: 0.1 mg/dL (ref 0.00–0.40)
CO2: 21 mmol/L (ref 20–29)
CREATININE: 1.07 mg/dL (ref 0.76–1.27)
Calcium: 10.3 mg/dL — ABNORMAL HIGH (ref 8.7–10.2)
Chloride: 96 mmol/L (ref 96–106)
GFR calc Af Amer: 99 mL/min/{1.73_m2} (ref >59)
GFR, EST NON AFRICAN AMERICAN: 86 mL/min/{1.73_m2} (ref >59)
GLUCOSE: 447 mg/dL — AB (ref 65–99)
POTASSIUM: 4.4 mmol/L (ref 3.5–5.2)
Sodium: 136 mmol/L (ref 134–144)
Total Protein: 7.6 g/dL (ref 6.0–8.5)

## 2017-11-29 ENCOUNTER — Other Ambulatory Visit: Payer: Self-pay

## 2017-11-29 ENCOUNTER — Encounter: Payer: Self-pay | Admitting: Family Medicine

## 2017-11-29 ENCOUNTER — Ambulatory Visit: Payer: Self-pay | Attending: Family Medicine | Admitting: Family Medicine

## 2017-11-29 ENCOUNTER — Telehealth (INDEPENDENT_AMBULATORY_CARE_PROVIDER_SITE_OTHER): Payer: Self-pay | Admitting: *Deleted

## 2017-11-29 VITALS — BP 139/86 | HR 80 | Temp 98.1°F | Resp 12 | Wt 202.2 lb

## 2017-11-29 DIAGNOSIS — Z7984 Long term (current) use of oral hypoglycemic drugs: Secondary | ICD-10-CM | POA: Insufficient documentation

## 2017-11-29 DIAGNOSIS — Z79899 Other long term (current) drug therapy: Secondary | ICD-10-CM | POA: Insufficient documentation

## 2017-11-29 DIAGNOSIS — B070 Plantar wart: Secondary | ICD-10-CM | POA: Insufficient documentation

## 2017-11-29 DIAGNOSIS — E1140 Type 2 diabetes mellitus with diabetic neuropathy, unspecified: Secondary | ICD-10-CM | POA: Insufficient documentation

## 2017-11-29 DIAGNOSIS — E1165 Type 2 diabetes mellitus with hyperglycemia: Secondary | ICD-10-CM | POA: Insufficient documentation

## 2017-11-29 LAB — GLUCOSE, POCT (MANUAL RESULT ENTRY): POC Glucose: 171 mg/dl — AB (ref 70–99)

## 2017-11-29 NOTE — Telephone Encounter (Signed)
Patient verified DOB Patient is aware of labs being normal including kidney and liver function. No further questions.

## 2017-11-29 NOTE — Patient Instructions (Signed)
Go to the ED if symptoms persist Plantar Warts Warts are small growths on the skin. They can occur on various areas of the body. When they occur on the underside (sole) of the foot, they are called plantar warts. Plantar warts often occur in groups, with several small warts around a larger growth. They tend to develop over areas of pressure, such as the heel or the ball of the foot. Most warts are not painful, and they usually do not cause problems. However, plantar warts may cause pain when you walk because pressure is applied to them. Warts often go away on their own in time. Various treatments may be done if needed. Sometimes, warts go away and then they come back again. What are the causes? Plantar warts are caused by a type of virus that is called human papillomavirus (HPV). HPV attacks a break in the skin of the foot. Walking barefoot can lead to exposure to the virus. These warts may spread to other areas of the sole. They spread to other areas of the body only through direct contact. What increases the risk? Plantar warts are more likely to develop in:  People who are 2110-42 years of age.  People who use public showers or locker rooms.  People who have a weakened body defense system (immune system).  What are the signs or symptoms? Plantar warts may be flat or slightly raised. They may grow into the deeper layers of skin or rise above the surface of the skin. Most plantar warts have a rough surface. They may cause pain when you use your foot to support your body weight. How is this diagnosed? A plantar wart can usually be diagnosed from its appearance. In some cases, a tissue sample may be removed (biopsy) to be looked at under a microscope. How is this treated? In many cases, warts do not need treatment. Without treatment, they often go away over a period of many months to a couple years. If treatment is needed, options may include:  Applying medicated solutions, creams, or patches to  the wart. These may be over-the-counter or prescription medicines that make the skin soft so that layers will gradually shed away. In many cases, the medicine is applied one or two times per day and covered with a bandage.  Putting duct tape over the top of the wart (occlusion). You will leave the tape in place for as long as told by your health care provider, then you will replace it with a new strip of tape. This is done until the wart goes away.  Freezing the wart with liquid nitrogen (cryotherapy).  Burning the wart with: ? Laser treatment. ? An electrified probe (electrocautery).  Injection of a medicine (Candida antigen) into the wart to help the body's immune system to fight off the wart.  Surgery to remove the wart.  Follow these instructions at home:  Apply medicated creams or solutions only as told by your health care provider. This may involve: ? Soaking the affected area in warm water. ? Removing the top layer of softened skin before you apply the medicine. A pumice stone works well for removing the tissue. ? Applying a bandage over the affected area after you apply the medicine. ? Repeating the process daily or as told by your health care provider.  Do not scratch or pick at a wart.  Wash your hands after you touch a wart.  If a wart is painful, try applying a bandage with a hole in the middle  over the wart. The helps to take pressure off the wart.  Keep all follow-up visits as told by your health care provider. This is important. How is this prevented? Take these actions to help prevent warts:  Wear shoes and socks. Change your socks daily.  Keep your feet clean and dry.  Check your feet regularly.  Avoid direct contact with warts on other people.  Contact a health care provider if:  Your warts do not improve after treatment.  You have redness, swelling, or pain at the site of a wart.  You have bleeding from a wart that does not stop with light  pressure.  You have diabetes and you develop a wart. This information is not intended to replace advice given to you by your health care provider. Make sure you discuss any questions you have with your health care provider. Document Released: 01/09/2004 Document Revised: 03/26/2016 Document Reviewed: 01/14/2015 Elsevier Interactive Patient Education  [employer].

## 2017-11-29 NOTE — Telephone Encounter (Signed)
-----   Message from Lizbeth BarkMandesia R Hairston, FNP sent at 11/24/2017 10:48 AM EST ----- Labs normal Liver function normal Kidney function normal

## 2017-11-29 NOTE — Progress Notes (Signed)
Foot pain from plantar wart

## 2017-11-30 ENCOUNTER — Other Ambulatory Visit: Payer: Self-pay

## 2017-11-30 ENCOUNTER — Emergency Department (HOSPITAL_COMMUNITY)
Admission: EM | Admit: 2017-11-30 | Discharge: 2017-11-30 | Disposition: A | Discharge status: Home / Self Care | Payer: Self-pay | Attending: Emergency Medicine | Admitting: Emergency Medicine

## 2017-11-30 ENCOUNTER — Encounter (HOSPITAL_COMMUNITY): Payer: Self-pay

## 2017-11-30 ENCOUNTER — Ambulatory Visit: Payer: Self-pay | Attending: Family Medicine

## 2017-11-30 DIAGNOSIS — M79671 Pain in right foot: Secondary | ICD-10-CM | POA: Insufficient documentation

## 2017-11-30 DIAGNOSIS — I10 Essential (primary) hypertension: Secondary | ICD-10-CM | POA: Insufficient documentation

## 2017-11-30 DIAGNOSIS — E119 Type 2 diabetes mellitus without complications: Secondary | ICD-10-CM | POA: Insufficient documentation

## 2017-11-30 DIAGNOSIS — Z7984 Long term (current) use of oral hypoglycemic drugs: Secondary | ICD-10-CM | POA: Insufficient documentation

## 2017-11-30 DIAGNOSIS — Z79899 Other long term (current) drug therapy: Secondary | ICD-10-CM | POA: Insufficient documentation

## 2017-11-30 DIAGNOSIS — F17200 Nicotine dependence, unspecified, uncomplicated: Secondary | ICD-10-CM | POA: Insufficient documentation

## 2017-11-30 NOTE — ED Triage Notes (Signed)
Patient c/o right foot plantar's wart x 2 weeks.
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
## 2017-11-30 NOTE — Discharge Instructions (Signed)
Follow up in the foot clinic

## 2017-12-01 NOTE — Progress Notes (Signed)
Subjective:  Patient ID: Craig Hebert, male    DOB: 08/02/1976  Age: 42 y.o. MRN: [id number]  CC: Follow-up and Foot Pain   HPI Craig Hebert presents for  foot complaint history of calloused plantar wart. Right plantar foot pain. Onset about 3 weeks ago. He denies any history of injury or wounds. Associated symptoms include pain and tenderness with standing and weight bearing. He reports worsening symptoms. He denies taking anything for symptoms. History of diabetic feet neuropathy. Podiatry referral placed at last visit.   Outpatient Medications Prior to Visit  Medication Sig Dispense Refill  . BD ULTRA-FINE LANCETS lancets Use as instructed 100 each 12  . Blood Glucose Monitoring Suppl (TRUE METRIX METER) w/Device KIT 1 each by Does not apply route 3 (three) times daily. 1 kit 0  . fluticasone (FLONASE) 50 MCG/ACT nasal spray Place 1 spray into both nostrils daily. 16 g 0  . glipiZIDE (GLUCOTROL) 5 MG tablet Take 1 tablet (5 mg total) by mouth daily before breakfast. 30 tablet 2  . glucose blood (TRUE METRIX BLOOD GLUCOSE TEST) test strip Use as instructed 100 each 12  . lisinopril (PRINIVIL,ZESTRIL) 40 MG tablet TAKE 1 TABLET BY MOUTH DAILY. 30 tablet 2  . metFORMIN (GLUCOPHAGE) 1000 MG tablet Take 1 tablet (1,000 mg total) by mouth 2 (two) times daily with a meal. 60 tablet 2  . pravastatin (PRAVACHOL) 20 MG tablet Take 1 tablet (20 mg total) by mouth daily. 30 tablet 2  . pregabalin (LYRICA) 50 MG capsule Take 1 capsule (50 mg total) by mouth daily. 90 capsule 1  . gabapentin (NEURONTIN) 300 MG capsule TAKE 1 CAPSULE BY MOUTH TWICE DAILY. (Patient not taking: Reported on 11/29/2017) 60 capsule 0  . terbinafine (LAMISIL AT) 1 % cream Apply 1 application topically 2 (two) times daily. For 14 days. (Patient not taking: Reported on 11/29/2017) 30 g 0   No facility-administered medications prior to visit.     ROS Review of Systems  Constitutional: Negative.   Respiratory: Negative.     Cardiovascular: Negative.   Musculoskeletal:       Foot pain  Psychiatric/Behavioral: Negative.    Objective:  BP 139/86 (BP Location: Left Arm, Patient Position: Sitting, Cuff Size: Large)   Pulse 80   Temp 98.1 F (36.7 C) (Oral)   Resp 12   Wt 202 lb 3.2 oz (91.7 kg)   SpO2 99%   BMI 32.64 kg/m   BP/Weight 11/30/2017 11/29/2017 2/40/9735  Systolic BP 329 924 268  Diastolic BP 67 86 79  Wt. (Lbs) 203 202.2 199.2  BMI 32.77 32.64 32.15     Physical Exam  Constitutional: He appears well-developed and well-nourished.  Eyes: Conjunctivae are normal. Pupils are equal, round, and reactive to light.  Cardiovascular: Normal rate, regular rhythm, normal heart sounds and intact distal pulses.  Pulmonary/Chest: Effort normal and breath sounds normal.  Abdominal: Soft. Bowel sounds are normal.  Musculoskeletal:       Feet:  Skin: Skin is warm and dry.  Psychiatric: His mood appears anxious. He expresses no homicidal and no suicidal ideation. He expresses no suicidal plans and no homicidal plans. He is communicative. He is attentive.  Nursing note and vitals reviewed.    Assessment & Plan:   1. Plantar wart Calloused plantar wart Podiatry referral placed previous visit, however patient has not completed orange card process. If he is experiencing symptoms worse enough to interfere with his ability to bear weight or stand recommend urgent care or  ED visit for incision and removal.  - Ambulatory referral to Podiatry  2. Uncontrolled type 2 diabetes mellitus with hyperglycemia (HCC)   - Glucose (CBG)   Follow-up: Return for Go to ED if symptoms persist.   Alfonse Spruce FNP

## 2017-12-02 ENCOUNTER — Ambulatory Visit: Payer: Self-pay | Admitting: Pharmacist

## 2017-12-07 ENCOUNTER — Ambulatory Visit: Payer: Self-pay | Admitting: Pharmacist

## 2017-12-07 NOTE — Progress Notes (Deleted)
    S:     No chief complaint on file.   Patient arrives ***.  Presents for diabetes evaluation, education, and management.   Patient {Actions; denies-reports:120008} adherence with medications.  Current diabetes medications include: metformin 1000 mg BID, glipizide 5 mg daily  Current hypertension medications include:   Patient {Actions; denies-reports:120008} hypoglycemic events.  Patient reported dietary habits: Eats *** meals/day Breakfast:*** Lunch:*** Dinner:*** Snacks:*** Drinks:***  Patient reported exercise habits:    Patient {Actions; denies-reports:120008} nocturia.  Patient {Actions; denies-reports:120008} neuropathy. Patient {Actions; denies-reports:120008} visual changes. Patient {Actions; denies-reports:120008} self foot exams.    O:  Physical Exam   ROS   Lab Results  Component Value Date   HGBA1C 10.5 11/18/2017   There were no vitals filed for this visit.  Home fasting CBG: ***  2 hour post-prandial/random CBG: ***.   A/P: Diabetes longstanding currently uncontrolled based on A1c of 10.5. Patient {Actions; denies-reports:120008} hypoglycemic events and is able to verbalize appropriate hypoglycemia management plan. Patient {Actions; denies-reports:120008} adherence with medication. Control is suboptimal due to dietary indiscretion and sedentary lifestyle.   Next A1C anticipated April 2019.    Written patient instructions provided.  Total time in face to face counseling *** minutes.   Follow up in Pharmacist Clinic Visit ***.   Patient seen with Enid DerryVictoria Mitchell, PharmD Candidate

## 2017-12-31 MED FILL — ?METFORMIN HCL 1,000 MG TAB: 1000 | 30 days supply | Qty: 60 | Fill #1

## 2017-12-31 MED FILL — PRAVASTATIN NA 20 MG TAB: 20 | 30 days supply | Qty: 30 | Fill #1

## 2017-12-31 MED FILL — LISINOPRIL 40 MG TAB: 40 | 30 days supply | Qty: 30 | Fill #1

## 2017-12-31 MED FILL — glipiZIDE 5 MG TABS: 5 | 30 days supply | Qty: 30 | Fill #1

## 2018-06-06 ENCOUNTER — Emergency Department (HOSPITAL_COMMUNITY)
Admission: EM | Admit: 2018-06-06 | Discharge: 2018-06-06 | Disposition: A | Discharge status: Home / Self Care | Payer: Self-pay | Attending: Emergency Medicine | Admitting: Emergency Medicine

## 2018-06-06 ENCOUNTER — Encounter (HOSPITAL_COMMUNITY): Payer: Self-pay

## 2018-06-06 ENCOUNTER — Other Ambulatory Visit: Payer: Self-pay

## 2018-06-06 DIAGNOSIS — F17210 Nicotine dependence, cigarettes, uncomplicated: Secondary | ICD-10-CM | POA: Insufficient documentation

## 2018-06-06 DIAGNOSIS — R739 Hyperglycemia, unspecified: Secondary | ICD-10-CM

## 2018-06-06 DIAGNOSIS — Z79899 Other long term (current) drug therapy: Secondary | ICD-10-CM | POA: Insufficient documentation

## 2018-06-06 DIAGNOSIS — I10 Essential (primary) hypertension: Secondary | ICD-10-CM | POA: Insufficient documentation

## 2018-06-06 DIAGNOSIS — E1140 Type 2 diabetes mellitus with diabetic neuropathy, unspecified: Secondary | ICD-10-CM

## 2018-06-06 DIAGNOSIS — Z7984 Long term (current) use of oral hypoglycemic drugs: Secondary | ICD-10-CM | POA: Insufficient documentation

## 2018-06-06 DIAGNOSIS — E1165 Type 2 diabetes mellitus with hyperglycemia: Secondary | ICD-10-CM | POA: Insufficient documentation

## 2018-06-06 DIAGNOSIS — E7800 Pure hypercholesterolemia, unspecified: Secondary | ICD-10-CM

## 2018-06-06 LAB — CBG MONITORING, ED
Glucose-Capillary: 433 mg/dL — ABNORMAL HIGH (ref 70–99)
Glucose-Capillary: 309 mg/dL — ABNORMAL HIGH (ref 70–99)
Glucose-Capillary: 347 mg/dL — ABNORMAL HIGH (ref 70–99)

## 2018-06-06 LAB — CBC
HEMATOCRIT: 42.7 % (ref 39.0–52.0)
Hemoglobin: 15 g/dL (ref 13.0–17.0)
MCH: 28.3 pg (ref 26.0–34.0)
MCHC: 35.1 g/dL (ref 30.0–36.0)
MCV: 80.6 fL (ref 78.0–100.0)
PLATELETS: 154 10*3/uL (ref 150–400)
RBC: 5.3 MIL/uL (ref 4.22–5.81)
RDW: 11.8 % (ref 11.5–15.5)
WBC: 10.5 10*3/uL (ref 4.0–10.5)

## 2018-06-06 LAB — COMPREHENSIVE METABOLIC PANEL
ALT: 25 U/L (ref 0–44)
AST: 19 U/L (ref 15–41)
Albumin: 4.7 g/dL (ref 3.5–5.0)
Alkaline Phosphatase: 68 U/L (ref 38–126)
Anion gap: 10 (ref 5–15)
BILIRUBIN TOTAL: 0.5 mg/dL (ref 0.3–1.2)
BUN: 16 mg/dL (ref 6–20)
CHLORIDE: 97 mmol/L — AB (ref 98–111)
CO2: 28 mmol/L (ref 22–32)
Calcium: 10.2 mg/dL (ref 8.9–10.3)
Creatinine, Ser: 1.25 mg/dL — ABNORMAL HIGH (ref 0.61–1.24)
Glucose, Bld: 425 mg/dL — ABNORMAL HIGH (ref 70–99)
POTASSIUM: 4 mmol/L (ref 3.5–5.1)
Sodium: 135 mmol/L (ref 135–145)
TOTAL PROTEIN: 7.8 g/dL (ref 6.5–8.1)

## 2018-06-06 LAB — URINALYSIS, ROUTINE W REFLEX MICROSCOPIC
BACTERIA UA: NONE SEEN
Bilirubin Urine: NEGATIVE
Glucose, UA: >=500 mg/dL — AB
Hgb urine dipstick: NEGATIVE
Ketones, ur: 5 mg/dL — AB
Leukocytes, UA: NEGATIVE
Nitrite: NEGATIVE
Protein, ur: NEGATIVE mg/dL
SPECIFIC GRAVITY, URINE: 1.026 (ref 1.005–1.030)
pH: 6 (ref 5.0–8.0)

## 2018-06-06 LAB — LIPASE, BLOOD: LIPASE: 38 U/L (ref 11–51)

## 2018-06-06 MED ORDER — GLIPIZIDE 5 MG PO TABS: 5.0000 mg | ORAL_TABLET | Freq: Every day | ORAL | 0 refills | Status: DC

## 2018-06-06 MED ORDER — METFORMIN HCL 500 MG PO TABS
1000.0000 mg | ORAL_TABLET | Freq: Once | ORAL | Status: AC
  Administered 2018-06-06: 1000 mg via ORAL
  Filled 2018-06-06: qty 2

## 2018-06-06 MED ORDER — PRAVASTATIN SODIUM 20 MG PO TABS: 20.0000 mg | ORAL_TABLET | Freq: Every day | ORAL | 0 refills | Status: DC

## 2018-06-06 MED ORDER — LISINOPRIL 40 MG PO TABS: 40.0000 mg | ORAL_TABLET | Freq: Every day | ORAL | 0 refills | Status: DC

## 2018-06-06 MED ORDER — SODIUM CHLORIDE 0.9 % IV BOLUS
2000.0000 mL | Freq: Once | INTRAVENOUS | Status: AC
  Administered 2018-06-06: 1000 mL via INTRAVENOUS

## 2018-06-06 MED ORDER — LISINOPRIL 10 MG PO TABS
10.0000 mg | ORAL_TABLET | Freq: Once | ORAL | Status: AC
  Administered 2018-06-06: 10 mg via ORAL
  Filled 2018-06-06: qty 1

## 2018-06-06 MED ORDER — METFORMIN HCL 1000 MG PO TABS: 1000.0000 mg | ORAL_TABLET | Freq: Two times a day (BID) | ORAL | 0 refills | Status: DC

## 2018-06-06 NOTE — ED Triage Notes (Signed)
PT C/O SWEATING AND FEELING WEAK AND DIZZY WHILE RIDING THE BUS TODAY TO WORK. PT ADMITS TO NOT TAKING HIS MEDICINES REGUARLY OR CHECKING HIS BLOOD SUGARS. PT STS HE HAS BEEN OUT OF LISINOPRIL X1 MONTH.
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
## 2018-06-06 NOTE — Discharge Instructions (Signed)
Your prescriptions have been sent to your chosen pharmacy.  Please schedule an appointment for follow-up at the wellness clinic.

## 2018-06-07 MED FILL — LISINOPRIL 40 MG TABLET: 40 | 30 days supply | Qty: 30 | Fill #0

## 2018-06-07 MED FILL — metFORMIN HCL 1000 MG TABS: 1000 | 30 days supply | Qty: 60 | Fill #0

## 2018-08-16 ENCOUNTER — Other Ambulatory Visit: Payer: Self-pay

## 2018-08-16 ENCOUNTER — Encounter (HOSPITAL_COMMUNITY): Payer: Self-pay

## 2018-08-16 ENCOUNTER — Emergency Department (HOSPITAL_COMMUNITY)
Admission: EM | Admit: 2018-08-16 | Discharge: 2018-08-16 | Disposition: A | Discharge status: Home / Self Care | Payer: Self-pay | Attending: Emergency Medicine | Admitting: Emergency Medicine

## 2018-08-16 DIAGNOSIS — I10 Essential (primary) hypertension: Secondary | ICD-10-CM | POA: Insufficient documentation

## 2018-08-16 DIAGNOSIS — E119 Type 2 diabetes mellitus without complications: Secondary | ICD-10-CM | POA: Insufficient documentation

## 2018-08-16 DIAGNOSIS — F17210 Nicotine dependence, cigarettes, uncomplicated: Secondary | ICD-10-CM | POA: Insufficient documentation

## 2018-08-16 DIAGNOSIS — Z79899 Other long term (current) drug therapy: Secondary | ICD-10-CM | POA: Insufficient documentation

## 2018-08-16 DIAGNOSIS — Z7984 Long term (current) use of oral hypoglycemic drugs: Secondary | ICD-10-CM | POA: Insufficient documentation

## 2018-08-16 DIAGNOSIS — L03011 Cellulitis of right finger: Secondary | ICD-10-CM | POA: Insufficient documentation

## 2018-08-16 MED ORDER — LIDOCAINE HCL 2 % IJ SOLN
10.0000 mL | Freq: Once | INTRAMUSCULAR | Status: AC
  Administered 2018-08-16: 200 mg via INTRADERMAL
  Filled 2018-08-16: qty 20

## 2018-08-16 MED ORDER — DOXYCYCLINE HYCLATE 100 MG PO CAPS: 100.0000 mg | ORAL_CAPSULE | Freq: Two times a day (BID) | ORAL | 0 refills | Status: DC

## 2018-08-16 MED FILL — DOXYCYCLINE HYCLATE 100 MG: 100 | 7 days supply | Qty: 14 | Fill #0

## 2018-08-16 NOTE — Discharge Instructions (Addendum)
Please take antibiotics as directed.  The abscess on your finger was drained and will heal from the inside out, please soak in warm clean water 2-3 times a day to help promote healing.  Make sure you keep your finger clean and dry with a dressing over it, do not submerge underwater, no doing dishes until the fingers completely healed.  Monitor for signs of worsening infection such as redness, swelling, warmth, increasing drainage, or redness or swelling progressing down the finger, fevers or chills, if this occurs please return for reevaluation.
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
## 2018-08-16 NOTE — ED Notes (Signed)
Bed: WTR5 Expected date:  Expected time:  Means of arrival:  Comments: 

## 2018-08-16 NOTE — ED Triage Notes (Signed)
Pt reports that he has middle finger of rt hand swelling and pain at finger tip. Pt reports that symptoms started 2 weeks ago. 9/10 throbbing pain.

## 2019-04-14 ENCOUNTER — Encounter (HOSPITAL_COMMUNITY): Payer: Self-pay | Admitting: Emergency Medicine

## 2019-04-14 ENCOUNTER — Emergency Department (HOSPITAL_COMMUNITY)
Admission: EM | Admit: 2019-04-14 | Discharge: 2019-04-14 | Disposition: A | Discharge status: Home / Self Care | Payer: Self-pay | Attending: Emergency Medicine | Admitting: Emergency Medicine

## 2019-04-14 ENCOUNTER — Other Ambulatory Visit: Payer: Self-pay

## 2019-04-14 ENCOUNTER — Emergency Department (HOSPITAL_COMMUNITY): Payer: Self-pay

## 2019-04-14 DIAGNOSIS — S40262A Insect bite (nonvenomous) of left shoulder, initial encounter: Secondary | ICD-10-CM | POA: Insufficient documentation

## 2019-04-14 DIAGNOSIS — Y9259 Other trade areas as the place of occurrence of the external cause: Secondary | ICD-10-CM | POA: Insufficient documentation

## 2019-04-14 DIAGNOSIS — F17210 Nicotine dependence, cigarettes, uncomplicated: Secondary | ICD-10-CM | POA: Insufficient documentation

## 2019-04-14 DIAGNOSIS — Y9389 Activity, other specified: Secondary | ICD-10-CM | POA: Insufficient documentation

## 2019-04-14 DIAGNOSIS — E1165 Type 2 diabetes mellitus with hyperglycemia: Secondary | ICD-10-CM

## 2019-04-14 DIAGNOSIS — Y998 Other external cause status: Secondary | ICD-10-CM | POA: Insufficient documentation

## 2019-04-14 DIAGNOSIS — E1140 Type 2 diabetes mellitus with diabetic neuropathy, unspecified: Secondary | ICD-10-CM

## 2019-04-14 DIAGNOSIS — R079 Chest pain, unspecified: Secondary | ICD-10-CM | POA: Insufficient documentation

## 2019-04-14 DIAGNOSIS — Z7984 Long term (current) use of oral hypoglycemic drugs: Secondary | ICD-10-CM | POA: Insufficient documentation

## 2019-04-14 DIAGNOSIS — E7800 Pure hypercholesterolemia, unspecified: Secondary | ICD-10-CM

## 2019-04-14 DIAGNOSIS — I10 Essential (primary) hypertension: Secondary | ICD-10-CM | POA: Insufficient documentation

## 2019-04-14 DIAGNOSIS — Z79899 Other long term (current) drug therapy: Secondary | ICD-10-CM | POA: Insufficient documentation

## 2019-04-14 DIAGNOSIS — E119 Type 2 diabetes mellitus without complications: Secondary | ICD-10-CM | POA: Insufficient documentation

## 2019-04-14 DIAGNOSIS — W57XXXA Bitten or stung by nonvenomous insect and other nonvenomous arthropods, initial encounter: Secondary | ICD-10-CM | POA: Insufficient documentation

## 2019-04-14 LAB — CBC WITH DIFFERENTIAL/PLATELET
Abs Immature Granulocytes: 0.03 10*3/uL (ref 0.00–0.07)
Basophils Absolute: 0 10*3/uL (ref 0.0–0.1)
Basophils Relative: 1 %
Eosinophils Absolute: 0 10*3/uL (ref 0.0–0.5)
Eosinophils Relative: 1 %
HCT: 36.6 % — ABNORMAL LOW (ref 39.0–52.0)
Hemoglobin: 11.9 g/dL — ABNORMAL LOW (ref 13.0–17.0)
Immature Granulocytes: 1 %
Lymphocytes Relative: 29 %
Lymphs Abs: 1.9 10*3/uL (ref 0.7–4.0)
MCH: 28 pg (ref 26.0–34.0)
MCHC: 32.5 g/dL (ref 30.0–36.0)
MCV: 86.1 fL (ref 80.0–100.0)
Monocytes Absolute: 0.3 10*3/uL (ref 0.1–1.0)
Monocytes Relative: 5 %
Neutro Abs: 4.1 10*3/uL (ref 1.7–7.7)
Neutrophils Relative %: 63 %
Platelets: 124 10*3/uL — ABNORMAL LOW (ref 150–400)
RBC: 4.25 MIL/uL (ref 4.22–5.81)
RDW: 11.8 % (ref 11.5–15.5)
WBC: 6.4 10*3/uL (ref 4.0–10.5)
nRBC: 0 % (ref 0.0–0.2)

## 2019-04-14 LAB — BASIC METABOLIC PANEL
Anion gap: 11 (ref 5–15)
BUN: 14 mg/dL (ref 6–20)
CO2: 24 mmol/L (ref 22–32)
Calcium: 9 mg/dL (ref 8.9–10.3)
Chloride: 100 mmol/L (ref 98–111)
Creatinine, Ser: 1.16 mg/dL (ref 0.61–1.24)
GFR calc Af Amer: >60 mL/min (ref >60)
GFR calc non Af Amer: >60 mL/min (ref >60)
Glucose, Bld: 267 mg/dL — ABNORMAL HIGH (ref 70–99)
Potassium: 3.7 mmol/L (ref 3.5–5.1)
Sodium: 135 mmol/L (ref 135–145)

## 2019-04-14 LAB — TROPONIN I: Troponin I: <0.03 ng/mL (ref <0.03)

## 2019-04-14 LAB — CBG MONITORING, ED: Glucose-Capillary: 275 mg/dL — ABNORMAL HIGH (ref 70–99)

## 2019-04-14 MED ORDER — PRAVASTATIN SODIUM 20 MG PO TABS: 20.0000 mg | ORAL_TABLET | Freq: Every day | ORAL | 5 refills | Status: AC

## 2019-04-14 MED ORDER — ASPIRIN 81 MG PO CHEW
324.0000 mg | CHEWABLE_TABLET | Freq: Once | ORAL | Status: AC
  Administered 2019-04-14: 324 mg via ORAL
  Filled 2019-04-14: qty 4

## 2019-04-14 MED ORDER — METFORMIN HCL 1000 MG PO TABS: 1000.0000 mg | ORAL_TABLET | Freq: Two times a day (BID) | ORAL | 5 refills | Status: AC

## 2019-04-14 MED ORDER — LISINOPRIL 40 MG PO TABS: 40.0000 mg | ORAL_TABLET | Freq: Every day | ORAL | 5 refills | Status: AC

## 2019-04-14 MED ORDER — GLIPIZIDE 5 MG PO TABS: 5.0000 mg | ORAL_TABLET | Freq: Every day | ORAL | 5 refills | Status: AC

## 2019-04-14 MED ORDER — DIPHENHYDRAMINE HCL 50 MG/ML IJ SOLN
50.0000 mg | Freq: Once | INTRAMUSCULAR | Status: AC
  Administered 2019-04-14: 06:00:00 50 mg via INTRAVENOUS
  Filled 2019-04-14: qty 1

## 2019-04-14 NOTE — ED Notes (Signed)
Patient transported to X-ray 
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
## 2019-04-14 NOTE — ED Notes (Addendum)
Pt reports insect bites above left ankle, left/right lower back, left shoulder. Feels like neck is getting stiff. Said he "doesn't feel normal."

## 2020-02-10 ENCOUNTER — Other Ambulatory Visit: Payer: Self-pay

## 2020-02-10 ENCOUNTER — Encounter (HOSPITAL_BASED_OUTPATIENT_CLINIC_OR_DEPARTMENT_OTHER): Payer: Self-pay | Admitting: Emergency Medicine

## 2020-02-10 ENCOUNTER — Emergency Department (HOSPITAL_BASED_OUTPATIENT_CLINIC_OR_DEPARTMENT_OTHER)
Admission: EM | Admit: 2020-02-10 | Discharge: 2020-02-10 | Disposition: A | Discharge status: Home / Self Care | Payer: Self-pay | Attending: Emergency Medicine | Admitting: Emergency Medicine

## 2020-02-10 DIAGNOSIS — E119 Type 2 diabetes mellitus without complications: Secondary | ICD-10-CM | POA: Insufficient documentation

## 2020-02-10 DIAGNOSIS — I10 Essential (primary) hypertension: Secondary | ICD-10-CM | POA: Insufficient documentation

## 2020-02-10 DIAGNOSIS — K029 Dental caries, unspecified: Secondary | ICD-10-CM | POA: Insufficient documentation

## 2020-02-10 DIAGNOSIS — Z7984 Long term (current) use of oral hypoglycemic drugs: Secondary | ICD-10-CM | POA: Insufficient documentation

## 2020-02-10 DIAGNOSIS — F17210 Nicotine dependence, cigarettes, uncomplicated: Secondary | ICD-10-CM | POA: Insufficient documentation

## 2020-02-10 DIAGNOSIS — Z79899 Other long term (current) drug therapy: Secondary | ICD-10-CM | POA: Insufficient documentation

## 2020-02-10 MED ORDER — AMOXICILLIN-POT CLAVULANATE 875-125 MG PO TABS: 1.0000 | ORAL_TABLET | Freq: Two times a day (BID) | ORAL | 0 refills | Status: AC
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
## 2020-02-10 NOTE — Discharge Instructions (Addendum)
As discussed, your blood pressure and blood sugar will need to be addressed by your primary care doctor.  Please follow-up with them to have this rechecked when you are healthy and addressed.  Please follow-up with the dentist and I have recommended he follow-up with.  Please follow-up with your doctors at the [facility] health and wellness clinic.  Take Augmentin as prescribed.  Please use Tylenol and ibuprofen as described below for pain control.  Please follow-up with dentist.  Please use Tylenol or ibuprofen for pain.  You may use 600 mg ibuprofen every 6 hours or 1000 mg of Tylenol every 6 hours.  You may choose to alternate between the 2.  This would be most effective.  Not to exceed 4 g of Tylenol within 24 hours.  Not to exceed 3200 mg ibuprofen 24 hours.

## 2020-02-10 NOTE — ED Triage Notes (Signed)
L upper tooth pain which is causing a headache x 3 days. Also concerned that his BP is high, he has been out of meds for "months"

## 2021-03-06 ENCOUNTER — Other Ambulatory Visit: Payer: Self-pay

## 2021-03-06 ENCOUNTER — Emergency Department (HOSPITAL_BASED_OUTPATIENT_CLINIC_OR_DEPARTMENT_OTHER)
Admission: EM | Admit: 2021-03-06 | Discharge: 2021-03-06 | Disposition: A | Discharge status: Home / Self Care | Payer: Self-pay | Attending: Emergency Medicine | Admitting: Emergency Medicine

## 2021-03-06 DIAGNOSIS — I10 Essential (primary) hypertension: Secondary | ICD-10-CM | POA: Insufficient documentation

## 2021-03-06 DIAGNOSIS — Z7984 Long term (current) use of oral hypoglycemic drugs: Secondary | ICD-10-CM | POA: Insufficient documentation

## 2021-03-06 DIAGNOSIS — Z79899 Other long term (current) drug therapy: Secondary | ICD-10-CM | POA: Insufficient documentation

## 2021-03-06 DIAGNOSIS — E119 Type 2 diabetes mellitus without complications: Secondary | ICD-10-CM | POA: Insufficient documentation

## 2021-03-06 DIAGNOSIS — Z760 Encounter for issue of repeat prescription: Secondary | ICD-10-CM | POA: Insufficient documentation

## 2021-03-06 DIAGNOSIS — F17210 Nicotine dependence, cigarettes, uncomplicated: Secondary | ICD-10-CM | POA: Insufficient documentation

## 2021-03-06 MED ORDER — PREGABALIN 150 MG PO CAPS: 150.0000 mg | ORAL_CAPSULE | Freq: Two times a day (BID) | ORAL | 0 refills | Status: DC

## 2021-03-06 MED ORDER — PREGABALIN 75 MG PO CAPS
100.0000 mg | ORAL_CAPSULE | Freq: Once | ORAL | Status: AC
  Administered 2021-03-06: 100 mg via ORAL
  Filled 2021-03-06: qty 1

## 2021-03-06 NOTE — Progress Notes (Signed)
..   Transition of Care [facility]) - Emergency Department Mini Assessment   Patient Details  Name: Craig Hebert MRN: [id number] Date of Birth: 01-12-1976  [facility] CM/SW Contact:    Elliot Cousin, RN Phone Number: [phone] 03/06/2021, 1:42 PM   Clinical Narrative:  TOC CM spoke to pt and states he was going to [facility] but moved to [place]. He is going to follow up with [facility] to schedule a follow up PCP appt. Explained he is established with [facility] and he can call to follow up. States he has been coming to ED to get refills. Educated him on the importance of follow up with his PCP. Verbalized understanding. Provided pt with goodrx coupon for Lyrica for $3.44. States he has diabetes meds at home, a 90 day supply.   ED Mini Assessment: What brought you to the Emergency Department? : foot pain  Barriers to Discharge: No Barriers Identified  Barrier interventions: provided pt with goodrx coupon for Lyrica  Means of departure: Car  Interventions which prevented an admission or readmission: Medication Review,Follow-up medical appointment    Patient Contact and Communications  Admission diagnosis:  FEET PAIN Patient Active Problem List   Diagnosis Date Noted  . Elevated low density lipoprotein (LDL) cholesterol level 08/02/2017  . Diabetes mellitus without complication ([facility]) 07/29/2017   PCP:  Patient, No Pcp Per (Inactive) Pharmacy:   [facility] [address] [place] [address] Phone: [phone] Fax: [phone]  [facility] [address], [place] - [address] [place] [address] Phone: [phone] Fax: [phone]  [facility] [address] [place], [place] - [address] [place] [address] Phone: [phone] Fax: [phone]

## 2021-03-06 NOTE — ED Notes (Signed)
Needs medication refill for lyrica  out of meds for about a year. Pt wants MD to look at feet.  No PCP
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
## 2021-03-06 NOTE — ED Triage Notes (Signed)
Needs medication refill for lyrica  out of meds for about a year No PCP

## 2021-03-06 NOTE — ED Notes (Signed)
Pt given another blanket and lights turned down per request

## 2021-03-06 NOTE — ED Notes (Signed)
ED Provider at bedside. 

## 2021-03-06 NOTE — Discharge Instructions (Addendum)
I have provided a referral to the clinic that you will need to call in order to obtain primary care.  This clinic will allow you to obtain refill on medications.  I have also provided a refill for your Lyrica, this is only a short prescription.  You will need to obtain this medication from your primary care physician.

## 2021-03-17 ENCOUNTER — Emergency Department (HOSPITAL_BASED_OUTPATIENT_CLINIC_OR_DEPARTMENT_OTHER): Payer: Self-pay

## 2021-03-17 ENCOUNTER — Encounter (HOSPITAL_BASED_OUTPATIENT_CLINIC_OR_DEPARTMENT_OTHER): Payer: Self-pay | Admitting: *Deleted

## 2021-03-17 ENCOUNTER — Other Ambulatory Visit: Payer: Self-pay

## 2021-03-17 ENCOUNTER — Emergency Department (HOSPITAL_BASED_OUTPATIENT_CLINIC_OR_DEPARTMENT_OTHER)
Admission: EM | Admit: 2021-03-17 | Discharge: 2021-03-17 | Disposition: A | Discharge status: Home / Self Care | Payer: Self-pay | Attending: Emergency Medicine | Admitting: Emergency Medicine

## 2021-03-17 DIAGNOSIS — F17210 Nicotine dependence, cigarettes, uncomplicated: Secondary | ICD-10-CM | POA: Insufficient documentation

## 2021-03-17 DIAGNOSIS — I10 Essential (primary) hypertension: Secondary | ICD-10-CM | POA: Insufficient documentation

## 2021-03-17 DIAGNOSIS — Z7984 Long term (current) use of oral hypoglycemic drugs: Secondary | ICD-10-CM | POA: Insufficient documentation

## 2021-03-17 DIAGNOSIS — E1142 Type 2 diabetes mellitus with diabetic polyneuropathy: Secondary | ICD-10-CM | POA: Insufficient documentation

## 2021-03-17 DIAGNOSIS — Z79899 Other long term (current) drug therapy: Secondary | ICD-10-CM | POA: Insufficient documentation

## 2021-03-17 DIAGNOSIS — N12 Tubulo-interstitial nephritis, not specified as acute or chronic: Secondary | ICD-10-CM | POA: Insufficient documentation

## 2021-03-17 HISTORY — DX: Cocaine abuse, uncomplicated: F14.10

## 2021-03-17 HISTORY — DX: Opioid abuse, uncomplicated: F11.10

## 2021-03-17 HISTORY — DX: Poisoning by unspecified drugs, medicaments and biological substances, accidental (unintentional), initial encounter: T50.901A

## 2021-03-17 LAB — URINALYSIS, ROUTINE W REFLEX MICROSCOPIC
Bilirubin Urine: NEGATIVE
Glucose, UA: NEGATIVE mg/dL
Hgb urine dipstick: NEGATIVE
Ketones, ur: NEGATIVE mg/dL
Nitrite: NEGATIVE
Protein, ur: NEGATIVE mg/dL
Specific Gravity, Urine: 1.015 (ref 1.005–1.030)
pH: 5.5 (ref 5.0–8.0)

## 2021-03-17 LAB — URINALYSIS, MICROSCOPIC (REFLEX)

## 2021-03-17 LAB — CBG MONITORING, ED: Glucose-Capillary: 147 mg/dL — ABNORMAL HIGH (ref 70–99)

## 2021-03-17 MED ORDER — CEFTRIAXONE SODIUM 1 G IJ SOLR
1.0000 g | Freq: Once | INTRAMUSCULAR | Status: AC
  Administered 2021-03-17: 1 g via INTRAMUSCULAR
  Filled 2021-03-17: qty 10

## 2021-03-17 MED ORDER — CEFPODOXIME PROXETIL 200 MG PO TABS: 200.0000 mg | ORAL_TABLET | Freq: Two times a day (BID) | ORAL | 0 refills | Status: AC

## 2021-03-17 MED ORDER — OXYCODONE-ACETAMINOPHEN 5-325 MG PO TABS
2.0000 | ORAL_TABLET | Freq: Once | ORAL | Status: AC
  Administered 2021-03-17: 2 via ORAL
  Filled 2021-03-17: qty 2

## 2021-03-17 MED ORDER — PREGABALIN 150 MG PO CAPS: 150.0000 mg | ORAL_CAPSULE | Freq: Two times a day (BID) | ORAL | 0 refills | Status: AC
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
## 2021-03-17 NOTE — ED Triage Notes (Signed)
Right flank x 3 days. Hx of diabetes.

## 2021-03-19 LAB — URINE CULTURE: Culture: NO GROWTH
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
